# Patient Record
Sex: Male | Born: 1965
Health system: Southern US, Community
[De-identification: ages and names within clinical notes are randomized; demographics above are authoritative.]

## PROBLEM LIST (undated history)

## (undated) DIAGNOSIS — G4730 Sleep apnea, unspecified: Secondary | ICD-10-CM

## (undated) HISTORY — DX: Sleep apnea, unspecified: G47.30

---

## 2004-12-05 ENCOUNTER — Ambulatory Visit: Payer: Self-pay | Admitting: Family Medicine

## 2004-12-19 ENCOUNTER — Ambulatory Visit: Payer: Self-pay | Admitting: Family Medicine

## 2005-10-15 ENCOUNTER — Ambulatory Visit: Payer: Self-pay | Admitting: Family Medicine

## 2006-03-13 ENCOUNTER — Ambulatory Visit: Payer: Self-pay | Admitting: Family Medicine

## 2006-07-15 ENCOUNTER — Ambulatory Visit: Payer: Self-pay | Admitting: Family Medicine

## 2006-09-02 ENCOUNTER — Ambulatory Visit (HOSPITAL_BASED_OUTPATIENT_CLINIC_OR_DEPARTMENT_OTHER): Admission: RE | Admit: 2006-09-02 | Discharge: 2006-09-02 | Payer: Self-pay | Admitting: Orthopedic Surgery

## 2007-05-07 ENCOUNTER — Inpatient Hospital Stay (HOSPITAL_COMMUNITY): Admission: RE | Admit: 2007-05-07 | Discharge: 2007-05-09 | Payer: Self-pay | Admitting: Orthopaedic Surgery

## 2007-11-10 ENCOUNTER — Encounter: Payer: Self-pay | Admitting: Family Medicine

## 2007-11-18 DIAGNOSIS — M545 Low back pain: Secondary | ICD-10-CM

## 2007-11-21 ENCOUNTER — Encounter: Payer: Self-pay | Admitting: Family Medicine

## 2007-11-21 ENCOUNTER — Emergency Department (HOSPITAL_COMMUNITY): Admission: EM | Admit: 2007-11-21 | Discharge: 2007-11-21 | Payer: Self-pay | Admitting: Emergency Medicine

## 2008-01-14 ENCOUNTER — Encounter: Payer: Self-pay | Admitting: Family Medicine

## 2010-03-13 ENCOUNTER — Encounter: Payer: Self-pay | Admitting: Family Medicine

## 2010-10-04 NOTE — Letter (Signed)
Summary: [facility]  [facility]   Imported By: Lanelle Bal 03/17/2010 11:54:31  _____________________________________________________________________  External Attachment:    Type:   Image     Comment:   External Document  Appended Document: [facility]    Clinical Lists Changes

## 2011-01-16 NOTE — Op Note (Signed)
NAMEZACHERIE, [name]                ACCOUNT NO.:  [id number]   MEDICAL RECORD NO.:  [id number]          PATIENT TYPE:  INP   LOCATION:  2550                         FACILITY:  [facility]   PHYSICIAN:  Sharolyn Douglas, M.D.        DATE OF BIRTH:  12-09-1965   DATE OF PROCEDURE:  05/07/2007  DATE OF DISCHARGE:                               OPERATIVE REPORT   DIAGNOSES:  1. L5-S1 isthmic spondylolisthesis.  2. L5-S1 foraminal narrowing with right greater than left lower      extremity radiculopathy.   PROCEDURES:  1. L5-S1 laminectomy with wide decompression of the thecal sac and      nerve roots bilaterally.  2. L5-S1 arthrodesis, posterior.  3. Transforaminal lumbar interbody fusion L5-S1 with placement of 12      mm PEEK cage.  4. Pedicle screw instrumentation L5-S1 using the Abbott spine system.  5. Local autogenous bone graft supplemented with OP1 BMP.   SURGEON:  Sharolyn Douglas, MD.   ASSISTANT:  Orlin Hilding, PA.   ANESTHESIA:  General endotracheal.   ESTIMATED BLOOD LOSS:  300 mL.   COMPLICATIONS:  None.   Needle and sponge count correct.   INDICATIONS:  The patient is a pleasant 45 year old male with  progressive back and right greater than left lower extremity pain.  Imaging studies show an isthmic spondylolisthesis at L5-S1 with several  millimeters of motion through flexion and extension.  He has severe  foraminal stenosis right greater than left.  He has failed other  conservative treatment modalities and now presents for lumbar  decompression and fusion as above.  Risks, benefits and alternative were  reviewed.  The patient elected to proceed.   PROCEDURE:  After informed consent, he was taken to the operating room.  He underwent general endotracheal anesthesia without difficulty, given  prophylactic IV antibiotics.  Neuro monitoring was established in the  form of lower extremity EMGs and SSEPs.  He was carefully turned prone  onto the Wilson frame.  All bony prominences  padded.  Face and eyes  protected at all times.  Back prepped and draped in the usual sterile  fashion.  Midline incision made from L5 down to S1 in the midline.  Dissection was carried sharply through the deep fascia.  Subperiosteal  exposure was carried out to the tips of the transverse processes of L5  and the sacral ala bilaterally.  Intraoperative x-ray was taken to  confirm the levels.  The L5 spinous process and lamina was loose,  consistent with the diagnosis of isthmic spondylolisthesis.  Deep  retractors were placed.  We then performed a laminectomy by removing the  entire Gill fragment.  Further decompression out into the foramen and  lateral recess was completed out over the L5 nerve roots.  On the right  side, we found severe foraminal stenosis due to up-down narrowing of the  foramen and also impingement of the foramen by the cartilaginous  material from the pars defect.  This was all decompressed.  Similar  findings were noted on the left side but not has severe.  Once  we were  satisfied with our decompression, we turned our attention to placing  pedicle screws at L5 and S1 bilaterally.   By using antiprobing technique and also palpating the pedicles from  within the spinal canal, the pedicles were cannulated.  The pedicles  were palpated and there were no breeches, both within the pedicle itself  and also from within the spinal canal.  After the pedicles were tapped,  we placed 6.5 x 40 mm screws at L5 and 7.5 x 30 mm screws in the sacrum.  The bone quality was average and the screw purchase was acceptable.   At this point, we completed the posterior spinal arthrodesis by  decorticating the transverse processes of L5 and also the sacral ala  bilaterally.  Local autogenous bone graft obtained from the laminectomy  had been cleaned and morselized and this was packed into the lateral  gutters between L5-S1.   At this point, we elected to proceed with a transforaminal  lumbar  interbody fusion to further indirectly decompress the neuroforamen,  reduce the spondylolisthesis and improve the fusion rate.  On the right  side, the remaining facet joint was osteotomized.  The exiting  transversing nerve roots were identified and protected at all times.  Free running EMGs were monitored.  A transforaminal window was created.  The disk space was entered and a radical diskectomy completed.  The  cartilaginous endplates were scraped with curved curettes.  The disk  space was dilated up to 12 mm.  We then packed the disk space with local  bone graft from the laminectomy along with the OP-1 BMP.  We also packed  the BMP into the 12 mm PEEK cage.  The cage was then inserted into the  interspace and flipped longitudinally.  There were no deleterious  changes in the free running EMGs throughout the TLIF procedure.  At this  point, we turned our attention to placing short segment 40 mm titanium  rods into the polyaxial screw heads.  Gentle compression was applied  across the L5-S1 segment before shearing off the locking caps. We took  an intraoperative x-ray which showed appropriate positioning of the  instrumentation at L5-S1 with partial reduction of the  spondylolisthesis.  Hemostasis was achieved.  The wound was irrigated.  Gelfoam was left over the exposed epidural space.  The deep Hemovac  drain was placed.  The deep fascia closed with a running #1 Vicryl  suture.  Subcutaneous layer closed with 0 Vicryl and 2-0 Vicryl,  followed by a running 3-0 subcuticular Vicryl suture for the skin edges.  Dermabond was applied.  Sterile dressing placed.  Patient turned supine,  extubated without difficulty and transferred to recovery in stable  condition.   It should be noted my assistant, Orlin Hilding, PA, was present throughout  the procedure.  She assisted me with positioning and also exposure by  using the suction and Cobb elevators.  She then worked with me using   loupes and headlight magnification during the laminectomy and also the  TLIF and arthrodesis.  Finally, she assisted with wound closure.      Sharolyn Douglas, M.D.  Electronically Signed     MC/MEDQ  D:  05/07/2007  T:  05/07/2007  Job:  [id number]

## 2011-01-19 NOTE — Op Note (Signed)
NAMEGOVANI, RADLOFF                ACCOUNT NO.:  [id number]   MEDICAL RECORD NO.:  [id number]          PATIENT TYPE:  AMB   LOCATION:  DSC                          FACILITY:  [facility]   PHYSICIAN:  Robert A. Thurston Hole, M.D. DATE OF BIRTH:  1966/03/16   DATE OF PROCEDURE:  09/02/2006  DATE OF DISCHARGE:                               OPERATIVE REPORT   PREOPERATIVE DIAGNOSES:  1. Right knee medial and lateral meniscal tears with chondromalacia,      degenerative joint disease and synovitis.  2. Left knee chondromalacia and synovitis.   POSTOPERATIVE DIAGNOSES:  1. Right knee medial and lateral meniscal tears with chondromalacia,      degenerative joint disease and synovitis.  2. Left knee chondromalacia and synovitis.   PROCEDURES:  1. Right knee examination under anesthesia, followed by arthroscopic      partial medial and lateral meniscectomies.  2. Right knee chondroplasty with partial synovectomy.  3. Left knee cortisone injection.   SURGEON:  Elana Alm. Thurston Hole, M.D.   ASSISTANT:  Julien Girt, P.A.   ANESTHESIA:  General.   OPERATIVE TIME:  30 minutes.   COMPLICATIONS:  None.   INDICATIONS FOR PROCEDURES:  [name] is a 45 year old gentleman, who has  had significant right knee pain, and to a lesser degree, left knee pain  for the past 6 months increasing in nature, with exam and MRI  documenting medial meniscal tear with chondromalacia, DJD and synovitis.  He has failed conservative care and is now to undergo right knee  arthroscopy and left knee cortisone injection.   DESCRIPTION:  Mr. Centrella was brought to the operating room on 09/02/06,  and placed on the operating table in supine position.  After being  placed under general anesthesia, his right knee was examined.  He had a  full range of motion and his knee was stable on ligamentous exam, with  normal patellar tracking.  Left knee had full range of motion and the  knee was stable on ligamentous exam with normal  patellar tracking.  The  left knee was injected with 40 mg of Depo-Medrol and 3 cc of 0.25%  Marcaine with epinephrine under sterile conditions, and he tolerated  this well.  The right knee was injected with 0.25% Marcaine with  epinephrine.  He tolerated this well, and the right leg was then prepped  using sterile DuraPrep, and draped using sterile technique.  Originally,  through a right knee anterolateral portal, the arthroscope with a pump  was placed, and through an anteromedial portal, an arthroscopic probe  was placed.  On initial inspection of the medial compartment, he was  found to have 20% grade 4, and the rest, grade 3 chondromalacia, which  was debrided.  The medial meniscus remnant, of which he had had a  partial previous medial meniscectomy - he still had 50% of the medial  meniscus left, and another 30 to 40% of this was torn and this was  resected back to a stable rim.  Intercondylar notch inspected.  Anterior  and posterior cruciate ligaments were normal.  Lateral compartment  showed 25% grade  3 chondromalacia, which was debrided.  Lateral meniscus  partial tear, 20%, posterolateral horn, which was resected back to a  stable rim.  Patellofemoral joint showed 50% grade 3 chondromalacia on  the patellofemoral groove, and this was debrided.  The patella tracked  normally.  Moderate synovitis in the medial and lateral gutters was  debrided, otherwise, they were free of pathology.  After this was done,  it was felt that all pathology had been satisfactorily addressed.  Instruments were removed.  Portals were closed with 3-0 nylon suture and  injected with 0.25% Marcaine with epinephrine and 4 mg of morphine.  Sterile dressings were applied, and the patient was awakened and taken  to the recovery room in stable condition.   FOLLOWUP CARE:  Mr. Dowding will be followed as an outpatient on Percocet  and Naprosyn.  He will see me back in the office in a week for sutures  out and  followup.      Robert A. Thurston Hole, M.D.  Electronically Signed     RAW/MEDQ  D:  09/02/2006  T:  09/02/2006  Job:  [id number]

## 2011-05-28 LAB — I-STAT 8, (EC8 V) (CONVERTED LAB)
Acid-Base Excess: 1
Bicarbonate: 26.7 — ABNORMAL HIGH
Glucose, Bld: 98
Sodium: 138
TCO2: 28
pH, Ven: 7.377 — ABNORMAL HIGH

## 2011-06-15 LAB — TYPE AND SCREEN
ABO/RH(D): A POS
Antibody Screen: NEGATIVE

## 2011-06-15 LAB — URINALYSIS, ROUTINE W REFLEX MICROSCOPIC
Nitrite: NEGATIVE
Specific Gravity, Urine: 1.023
Urobilinogen, UA: 0.2

## 2011-06-15 LAB — PROTIME-INR: INR: 1

## 2011-06-15 LAB — HEMOGLOBIN AND HEMATOCRIT, BLOOD
HCT: 38.9 — ABNORMAL LOW
Hemoglobin: 12.2 — ABNORMAL LOW
Hemoglobin: 13.5

## 2011-06-15 LAB — BASIC METABOLIC PANEL
BUN: 9
Calcium: 8.4
Calcium: 8.9
Chloride: 103
Creatinine, Ser: 0.88
Creatinine, Ser: 0.92
GFR calc Af Amer: >60
GFR calc non Af Amer: >60
GFR calc non Af Amer: >60
Sodium: 138

## 2011-06-15 LAB — URINE CULTURE

## 2011-06-15 LAB — CBC
Hemoglobin: 15.8
MCHC: 34.7
Platelets: 234
RDW: 13.6

## 2011-06-15 LAB — COMPREHENSIVE METABOLIC PANEL
ALT: 24
Calcium: 9.8
Glucose, Bld: 75
Sodium: 137
Total Protein: 6.9

## 2011-06-15 LAB — ABO/RH: ABO/RH(D): A POS

## 2011-06-15 LAB — APTT: aPTT: 30

## 2011-06-15 LAB — DIFFERENTIAL
Eosinophils Absolute: 0.4
Lymphs Abs: 3
Monocytes Relative: 6
Neutro Abs: 7.3
Neutrophils Relative %: 63

## 2014-08-30 ENCOUNTER — Ambulatory Visit: Payer: Self-pay

## 2014-08-30 ENCOUNTER — Other Ambulatory Visit: Payer: Self-pay | Admitting: Occupational Medicine

## 2014-08-30 DIAGNOSIS — M25562 Pain in left knee: Secondary | ICD-10-CM

## 2014-09-03 HISTORY — PX: JOINT REPLACEMENT: SHX530

## 2017-08-23 DIAGNOSIS — Z96652 Presence of left artificial knee joint: Secondary | ICD-10-CM | POA: Insufficient documentation

## 2018-09-03 HISTORY — PX: REVISION TOTAL KNEE ARTHROPLASTY: SUR1280

## 2018-11-01 DIAGNOSIS — J028 Acute pharyngitis due to other specified organisms: Secondary | ICD-10-CM | POA: Diagnosis not present

## 2018-11-24 DIAGNOSIS — Z0000 Encounter for general adult medical examination without abnormal findings: Secondary | ICD-10-CM | POA: Diagnosis not present

## 2019-01-12 DIAGNOSIS — M25462 Effusion, left knee: Secondary | ICD-10-CM | POA: Diagnosis not present

## 2019-01-12 DIAGNOSIS — Z96652 Presence of left artificial knee joint: Secondary | ICD-10-CM | POA: Diagnosis not present

## 2019-01-12 DIAGNOSIS — M25562 Pain in left knee: Secondary | ICD-10-CM | POA: Diagnosis not present

## 2019-02-11 DIAGNOSIS — M25462 Effusion, left knee: Secondary | ICD-10-CM | POA: Diagnosis not present

## 2019-02-11 DIAGNOSIS — M25562 Pain in left knee: Secondary | ICD-10-CM | POA: Diagnosis not present

## 2019-04-15 DIAGNOSIS — M25569 Pain in unspecified knee: Secondary | ICD-10-CM | POA: Diagnosis not present

## 2019-04-15 DIAGNOSIS — T84033D Mechanical loosening of internal left knee prosthetic joint, subsequent encounter: Secondary | ICD-10-CM | POA: Diagnosis not present

## 2019-04-15 DIAGNOSIS — Z01818 Encounter for other preprocedural examination: Secondary | ICD-10-CM | POA: Diagnosis not present

## 2019-04-28 DIAGNOSIS — R0609 Other forms of dyspnea: Secondary | ICD-10-CM | POA: Diagnosis not present

## 2019-04-28 DIAGNOSIS — M25562 Pain in left knee: Secondary | ICD-10-CM | POA: Diagnosis not present

## 2019-04-28 DIAGNOSIS — Z885 Allergy status to narcotic agent status: Secondary | ICD-10-CM | POA: Diagnosis not present

## 2019-04-28 DIAGNOSIS — Y792 Prosthetic and other implants, materials and accessory orthopedic devices associated with adverse incidents: Secondary | ICD-10-CM | POA: Diagnosis not present

## 2019-04-28 DIAGNOSIS — Z880 Allergy status to penicillin: Secondary | ICD-10-CM | POA: Diagnosis not present

## 2019-04-28 DIAGNOSIS — T84033A Mechanical loosening of internal left knee prosthetic joint, initial encounter: Secondary | ICD-10-CM | POA: Diagnosis not present

## 2019-04-28 DIAGNOSIS — Z6839 Body mass index (BMI) 39.0-39.9, adult: Secondary | ICD-10-CM | POA: Diagnosis not present

## 2019-04-28 DIAGNOSIS — Z87891 Personal history of nicotine dependence: Secondary | ICD-10-CM | POA: Diagnosis not present

## 2019-04-28 DIAGNOSIS — G4733 Obstructive sleep apnea (adult) (pediatric): Secondary | ICD-10-CM | POA: Diagnosis not present

## 2019-04-28 DIAGNOSIS — E669 Obesity, unspecified: Secondary | ICD-10-CM | POA: Diagnosis not present

## 2019-04-28 DIAGNOSIS — G8918 Other acute postprocedural pain: Secondary | ICD-10-CM | POA: Diagnosis not present

## 2019-04-28 DIAGNOSIS — Z96651 Presence of right artificial knee joint: Secondary | ICD-10-CM | POA: Diagnosis not present

## 2019-05-01 DIAGNOSIS — M25662 Stiffness of left knee, not elsewhere classified: Secondary | ICD-10-CM | POA: Diagnosis not present

## 2019-05-01 DIAGNOSIS — M25562 Pain in left knee: Secondary | ICD-10-CM | POA: Diagnosis not present

## 2019-05-05 DIAGNOSIS — M25662 Stiffness of left knee, not elsewhere classified: Secondary | ICD-10-CM | POA: Diagnosis not present

## 2019-05-05 DIAGNOSIS — M25562 Pain in left knee: Secondary | ICD-10-CM | POA: Diagnosis not present

## 2019-05-07 DIAGNOSIS — M25662 Stiffness of left knee, not elsewhere classified: Secondary | ICD-10-CM | POA: Diagnosis not present

## 2019-05-07 DIAGNOSIS — M25562 Pain in left knee: Secondary | ICD-10-CM | POA: Diagnosis not present

## 2019-05-12 DIAGNOSIS — M25562 Pain in left knee: Secondary | ICD-10-CM | POA: Diagnosis not present

## 2019-05-12 DIAGNOSIS — M25662 Stiffness of left knee, not elsewhere classified: Secondary | ICD-10-CM | POA: Diagnosis not present

## 2019-05-14 DIAGNOSIS — M25662 Stiffness of left knee, not elsewhere classified: Secondary | ICD-10-CM | POA: Diagnosis not present

## 2019-05-14 DIAGNOSIS — M25562 Pain in left knee: Secondary | ICD-10-CM | POA: Diagnosis not present

## 2019-05-19 DIAGNOSIS — M25562 Pain in left knee: Secondary | ICD-10-CM | POA: Diagnosis not present

## 2019-05-19 DIAGNOSIS — M25662 Stiffness of left knee, not elsewhere classified: Secondary | ICD-10-CM | POA: Diagnosis not present

## 2019-05-21 DIAGNOSIS — M25562 Pain in left knee: Secondary | ICD-10-CM | POA: Diagnosis not present

## 2019-05-21 DIAGNOSIS — M25662 Stiffness of left knee, not elsewhere classified: Secondary | ICD-10-CM | POA: Diagnosis not present

## 2019-05-26 DIAGNOSIS — M25662 Stiffness of left knee, not elsewhere classified: Secondary | ICD-10-CM | POA: Diagnosis not present

## 2019-05-26 DIAGNOSIS — M25562 Pain in left knee: Secondary | ICD-10-CM | POA: Diagnosis not present

## 2019-05-28 DIAGNOSIS — M25562 Pain in left knee: Secondary | ICD-10-CM | POA: Diagnosis not present

## 2019-05-28 DIAGNOSIS — M25662 Stiffness of left knee, not elsewhere classified: Secondary | ICD-10-CM | POA: Diagnosis not present

## 2019-06-02 DIAGNOSIS — M25562 Pain in left knee: Secondary | ICD-10-CM | POA: Diagnosis not present

## 2019-06-02 DIAGNOSIS — M25662 Stiffness of left knee, not elsewhere classified: Secondary | ICD-10-CM | POA: Diagnosis not present

## 2019-06-04 DIAGNOSIS — M25662 Stiffness of left knee, not elsewhere classified: Secondary | ICD-10-CM | POA: Diagnosis not present

## 2019-06-04 DIAGNOSIS — M25562 Pain in left knee: Secondary | ICD-10-CM | POA: Diagnosis not present

## 2019-06-09 DIAGNOSIS — M25662 Stiffness of left knee, not elsewhere classified: Secondary | ICD-10-CM | POA: Diagnosis not present

## 2019-06-09 DIAGNOSIS — M25562 Pain in left knee: Secondary | ICD-10-CM | POA: Diagnosis not present

## 2019-06-12 DIAGNOSIS — M25662 Stiffness of left knee, not elsewhere classified: Secondary | ICD-10-CM | POA: Diagnosis not present

## 2019-06-12 DIAGNOSIS — M25562 Pain in left knee: Secondary | ICD-10-CM | POA: Diagnosis not present

## 2019-06-16 DIAGNOSIS — M25562 Pain in left knee: Secondary | ICD-10-CM | POA: Diagnosis not present

## 2019-06-16 DIAGNOSIS — M25662 Stiffness of left knee, not elsewhere classified: Secondary | ICD-10-CM | POA: Diagnosis not present

## 2019-06-19 DIAGNOSIS — M25662 Stiffness of left knee, not elsewhere classified: Secondary | ICD-10-CM | POA: Diagnosis not present

## 2019-06-19 DIAGNOSIS — M25562 Pain in left knee: Secondary | ICD-10-CM | POA: Diagnosis not present

## 2019-06-23 DIAGNOSIS — M25662 Stiffness of left knee, not elsewhere classified: Secondary | ICD-10-CM | POA: Diagnosis not present

## 2019-06-23 DIAGNOSIS — M25562 Pain in left knee: Secondary | ICD-10-CM | POA: Diagnosis not present

## 2019-06-25 DIAGNOSIS — M5431 Sciatica, right side: Secondary | ICD-10-CM | POA: Diagnosis not present

## 2019-06-25 DIAGNOSIS — Z981 Arthrodesis status: Secondary | ICD-10-CM | POA: Diagnosis not present

## 2019-06-25 DIAGNOSIS — M25662 Stiffness of left knee, not elsewhere classified: Secondary | ICD-10-CM | POA: Diagnosis not present

## 2019-06-25 DIAGNOSIS — M25562 Pain in left knee: Secondary | ICD-10-CM | POA: Diagnosis not present

## 2019-06-25 DIAGNOSIS — M5432 Sciatica, left side: Secondary | ICD-10-CM | POA: Diagnosis not present

## 2019-06-29 DIAGNOSIS — M25562 Pain in left knee: Secondary | ICD-10-CM | POA: Diagnosis not present

## 2019-06-29 DIAGNOSIS — M25662 Stiffness of left knee, not elsewhere classified: Secondary | ICD-10-CM | POA: Diagnosis not present

## 2019-07-15 DIAGNOSIS — M5416 Radiculopathy, lumbar region: Secondary | ICD-10-CM | POA: Diagnosis not present

## 2019-07-15 DIAGNOSIS — M545 Low back pain: Secondary | ICD-10-CM | POA: Diagnosis not present

## 2019-07-16 DIAGNOSIS — M545 Low back pain: Secondary | ICD-10-CM | POA: Diagnosis not present

## 2019-07-16 DIAGNOSIS — M5416 Radiculopathy, lumbar region: Secondary | ICD-10-CM | POA: Diagnosis not present

## 2019-07-21 DIAGNOSIS — M545 Low back pain: Secondary | ICD-10-CM | POA: Diagnosis not present

## 2019-07-21 DIAGNOSIS — M5416 Radiculopathy, lumbar region: Secondary | ICD-10-CM | POA: Diagnosis not present

## 2019-07-23 DIAGNOSIS — Z0189 Encounter for other specified special examinations: Secondary | ICD-10-CM | POA: Diagnosis not present

## 2019-07-23 DIAGNOSIS — Z139 Encounter for screening, unspecified: Secondary | ICD-10-CM | POA: Diagnosis not present

## 2019-07-23 DIAGNOSIS — M5431 Sciatica, right side: Secondary | ICD-10-CM | POA: Diagnosis not present

## 2019-07-23 DIAGNOSIS — M5432 Sciatica, left side: Secondary | ICD-10-CM | POA: Diagnosis not present

## 2019-07-31 DIAGNOSIS — M5432 Sciatica, left side: Secondary | ICD-10-CM | POA: Diagnosis not present

## 2019-07-31 DIAGNOSIS — M5431 Sciatica, right side: Secondary | ICD-10-CM | POA: Diagnosis not present

## 2019-08-07 DIAGNOSIS — M5136 Other intervertebral disc degeneration, lumbar region: Secondary | ICD-10-CM | POA: Diagnosis not present

## 2019-08-24 DIAGNOSIS — M5416 Radiculopathy, lumbar region: Secondary | ICD-10-CM | POA: Diagnosis not present

## 2019-09-04 HISTORY — PX: BILATERAL CARPAL TUNNEL RELEASE: SHX6508

## 2019-09-17 DIAGNOSIS — R202 Paresthesia of skin: Secondary | ICD-10-CM | POA: Diagnosis not present

## 2019-09-17 DIAGNOSIS — G5603 Carpal tunnel syndrome, bilateral upper limbs: Secondary | ICD-10-CM | POA: Diagnosis not present

## 2019-09-29 DIAGNOSIS — G5603 Carpal tunnel syndrome, bilateral upper limbs: Secondary | ICD-10-CM | POA: Diagnosis not present

## 2019-10-19 DIAGNOSIS — M5416 Radiculopathy, lumbar region: Secondary | ICD-10-CM | POA: Diagnosis not present

## 2019-12-21 DIAGNOSIS — M5416 Radiculopathy, lumbar region: Secondary | ICD-10-CM | POA: Diagnosis not present

## 2020-03-29 DIAGNOSIS — G5601 Carpal tunnel syndrome, right upper limb: Secondary | ICD-10-CM | POA: Diagnosis not present

## 2020-05-18 DIAGNOSIS — G4733 Obstructive sleep apnea (adult) (pediatric): Secondary | ICD-10-CM | POA: Diagnosis not present

## 2020-05-18 DIAGNOSIS — K219 Gastro-esophageal reflux disease without esophagitis: Secondary | ICD-10-CM | POA: Diagnosis not present

## 2020-05-18 DIAGNOSIS — G8918 Other acute postprocedural pain: Secondary | ICD-10-CM | POA: Diagnosis not present

## 2020-05-18 DIAGNOSIS — G5621 Lesion of ulnar nerve, right upper limb: Secondary | ICD-10-CM | POA: Diagnosis not present

## 2020-05-18 DIAGNOSIS — M25531 Pain in right wrist: Secondary | ICD-10-CM | POA: Diagnosis not present

## 2020-05-18 DIAGNOSIS — Z880 Allergy status to penicillin: Secondary | ICD-10-CM | POA: Diagnosis not present

## 2020-05-18 DIAGNOSIS — E669 Obesity, unspecified: Secondary | ICD-10-CM | POA: Diagnosis not present

## 2020-05-18 DIAGNOSIS — Z96659 Presence of unspecified artificial knee joint: Secondary | ICD-10-CM | POA: Diagnosis not present

## 2020-05-18 DIAGNOSIS — Z6838 Body mass index (BMI) 38.0-38.9, adult: Secondary | ICD-10-CM | POA: Diagnosis not present

## 2020-05-18 DIAGNOSIS — Z885 Allergy status to narcotic agent status: Secondary | ICD-10-CM | POA: Diagnosis not present

## 2020-05-18 DIAGNOSIS — G5601 Carpal tunnel syndrome, right upper limb: Secondary | ICD-10-CM | POA: Diagnosis not present

## 2020-06-17 DIAGNOSIS — G5622 Lesion of ulnar nerve, left upper limb: Secondary | ICD-10-CM | POA: Diagnosis not present

## 2020-06-17 DIAGNOSIS — Z880 Allergy status to penicillin: Secondary | ICD-10-CM | POA: Diagnosis not present

## 2020-06-17 DIAGNOSIS — Z20822 Contact with and (suspected) exposure to covid-19: Secondary | ICD-10-CM | POA: Diagnosis not present

## 2020-06-17 DIAGNOSIS — Z882 Allergy status to sulfonamides status: Secondary | ICD-10-CM | POA: Diagnosis not present

## 2020-06-17 DIAGNOSIS — Z8616 Personal history of COVID-19: Secondary | ICD-10-CM | POA: Diagnosis not present

## 2020-06-17 DIAGNOSIS — E119 Type 2 diabetes mellitus without complications: Secondary | ICD-10-CM | POA: Diagnosis not present

## 2020-06-17 DIAGNOSIS — M25532 Pain in left wrist: Secondary | ICD-10-CM | POA: Diagnosis not present

## 2020-06-17 DIAGNOSIS — Z87891 Personal history of nicotine dependence: Secondary | ICD-10-CM | POA: Diagnosis not present

## 2020-06-17 DIAGNOSIS — Z885 Allergy status to narcotic agent status: Secondary | ICD-10-CM | POA: Diagnosis not present

## 2020-06-17 DIAGNOSIS — G4733 Obstructive sleep apnea (adult) (pediatric): Secondary | ICD-10-CM | POA: Diagnosis not present

## 2020-06-17 DIAGNOSIS — M1711 Unilateral primary osteoarthritis, right knee: Secondary | ICD-10-CM | POA: Diagnosis not present

## 2020-06-17 DIAGNOSIS — G8918 Other acute postprocedural pain: Secondary | ICD-10-CM | POA: Diagnosis not present

## 2020-06-17 DIAGNOSIS — Z86711 Personal history of pulmonary embolism: Secondary | ICD-10-CM | POA: Diagnosis not present

## 2020-06-17 DIAGNOSIS — G5602 Carpal tunnel syndrome, left upper limb: Secondary | ICD-10-CM | POA: Diagnosis not present

## 2020-08-01 ENCOUNTER — Ambulatory Visit (INDEPENDENT_AMBULATORY_CARE_PROVIDER_SITE_OTHER)
Admission: RE | Admit: 2020-08-01 | Discharge: 2020-08-01 | Disposition: A | Discharge status: Home / Self Care | Payer: BC Managed Care – PPO | Source: Ambulatory Visit | Attending: Family Medicine | Admitting: Family Medicine

## 2020-08-01 ENCOUNTER — Encounter: Payer: Self-pay | Admitting: Family Medicine

## 2020-08-01 ENCOUNTER — Other Ambulatory Visit: Payer: Self-pay

## 2020-08-01 ENCOUNTER — Ambulatory Visit (INDEPENDENT_AMBULATORY_CARE_PROVIDER_SITE_OTHER): Payer: BC Managed Care – PPO | Admitting: Family Medicine

## 2020-08-01 DIAGNOSIS — R0602 Shortness of breath: Secondary | ICD-10-CM

## 2020-08-01 DIAGNOSIS — Z9989 Dependence on other enabling machines and devices: Secondary | ICD-10-CM

## 2020-08-01 DIAGNOSIS — Z636 Dependent relative needing care at home: Secondary | ICD-10-CM

## 2020-08-01 DIAGNOSIS — D171 Benign lipomatous neoplasm of skin and subcutaneous tissue of trunk: Secondary | ICD-10-CM | POA: Insufficient documentation

## 2020-08-01 DIAGNOSIS — Z125 Encounter for screening for malignant neoplasm of prostate: Secondary | ICD-10-CM | POA: Diagnosis not present

## 2020-08-01 DIAGNOSIS — Z7689 Persons encountering health services in other specified circumstances: Secondary | ICD-10-CM | POA: Diagnosis not present

## 2020-08-01 DIAGNOSIS — Z1211 Encounter for screening for malignant neoplasm of colon: Secondary | ICD-10-CM

## 2020-08-01 DIAGNOSIS — G4733 Obstructive sleep apnea (adult) (pediatric): Secondary | ICD-10-CM | POA: Diagnosis not present

## 2020-08-01 DIAGNOSIS — R0609 Other forms of dyspnea: Secondary | ICD-10-CM | POA: Insufficient documentation

## 2020-08-01 DIAGNOSIS — R0789 Other chest pain: Secondary | ICD-10-CM

## 2020-08-01 LAB — CBC WITH DIFFERENTIAL/PLATELET
Basophils Absolute: 0.1 10*3/uL (ref 0.0–0.1)
Basophils Relative: 1.3 % (ref 0.0–3.0)
Eosinophils Absolute: 0.4 10*3/uL (ref 0.0–0.7)
Eosinophils Relative: 5.3 % — ABNORMAL HIGH (ref 0.0–5.0)
HCT: 47.6 % (ref 39.0–52.0)
Hemoglobin: 16.5 g/dL (ref 13.0–17.0)
Lymphocytes Relative: 28.3 % (ref 12.0–46.0)
Lymphs Abs: 2.3 10*3/uL (ref 0.7–4.0)
MCHC: 34.5 g/dL (ref 30.0–36.0)
MCV: 89 fl (ref 78.0–100.0)
Monocytes Absolute: 0.5 10*3/uL (ref 0.1–1.0)
Monocytes Relative: 5.5 % (ref 3.0–12.0)
Neutro Abs: 4.9 10*3/uL (ref 1.4–7.7)
Neutrophils Relative %: 59.6 % (ref 43.0–77.0)
Platelets: 214 10*3/uL (ref 150.0–400.0)
RBC: 5.35 Mil/uL (ref 4.22–5.81)
RDW: 14 % (ref 11.5–15.5)
WBC: 8.2 10*3/uL (ref 4.0–10.5)

## 2020-08-01 LAB — TSH: TSH: 1.93 u[IU]/mL (ref 0.35–4.50)

## 2020-08-01 LAB — COMPREHENSIVE METABOLIC PANEL
ALT: 31 U/L (ref 0–53)
AST: 27 U/L (ref 0–37)
Albumin: 4.5 g/dL (ref 3.5–5.2)
Alkaline Phosphatase: 48 U/L (ref 39–117)
BUN: 10 mg/dL (ref 6–23)
CO2: 30 mEq/L (ref 19–32)
Calcium: 9.8 mg/dL (ref 8.4–10.5)
Chloride: 99 mEq/L (ref 96–112)
Creatinine, Ser: 1.05 mg/dL (ref 0.40–1.50)
GFR: 81.68 mL/min (ref >60.00)
Glucose, Bld: 127 mg/dL — ABNORMAL HIGH (ref 70–99)
Potassium: 4 mEq/L (ref 3.5–5.1)
Sodium: 137 mEq/L (ref 135–145)
Total Bilirubin: 0.6 mg/dL (ref 0.2–1.2)
Total Protein: 7.7 g/dL (ref 6.0–8.3)

## 2020-08-01 LAB — VITAMIN D 25 HYDROXY (VIT D DEFICIENCY, FRACTURES): VITD: 38.08 ng/mL (ref 30.00–100.00)

## 2020-08-01 LAB — LIPID PANEL
Cholesterol: 134 mg/dL (ref 0–200)
HDL: 37.9 mg/dL — ABNORMAL LOW (ref >39.00)
LDL Cholesterol: 56 mg/dL (ref 0–99)
NonHDL: 95.76
Total CHOL/HDL Ratio: 4
Triglycerides: 200 mg/dL — ABNORMAL HIGH (ref 0.0–149.0)
VLDL: 40 mg/dL (ref 0.0–40.0)

## 2020-08-01 LAB — HEMOGLOBIN A1C: Hgb A1c MFr Bld: 5.5 % (ref 4.6–6.5)

## 2020-08-01 LAB — PSA: PSA: 1.25 ng/mL (ref 0.10–4.00)

## 2020-08-01 NOTE — Patient Instructions (Addendum)
Good to see you today  Please work on two things with your diet- cut out drinks with sugar (juice, soda, sweet tea) and cutting out snacks. Then work on cutting back on starches- pasta, bread, sweets, potatoes, rice.   Try  Looking for low carb or Keto recipes for soups, casseroles. Try making your lunches ahead of time and eating more protein at breakfast.   There is not one right eating plan for everyone.  It may take trial and error to find what will work for you.  It is important to get adequate protein and fiber with your meals.  It is okay to not eat breakfast or to skip meals if you are not hungry.  Avoid snacking between meals.  Unless you are on a fluid restriction, drink 80 to 90 ounces of water a day.  Suggested resources- www.dietdoctor.com/diabetes/diet www.adaptyourlifeacademy.com-there is a quiz to help you determine how many carbohydrates you should eat a day  www.thefastingmethod.com  If you have diabetes or access to a blood sugar machine, I recommend you check your blood sugar daily and keep a log.  Vary the time you check your blood sugar such as fasting, before meal, 2 hours after a meal and at bedtime.  Look for trends with the foods you are eating and be a scientist of your body.  Here are some guidelines to help you with meal planning -  Avoid all processed and packaged foods (bread, pasta, crackers, chips, etc) and beverages containing calories.  Avoid added sugars and excessive natural sugars.  Pay attention to how you feel if you consume artificial sweeteners.  Do they make you more hungry or raise your blood sugar?  With every meal and snack, aim to get 20 g of protein (3 ounces of meat, 4 ounces of fish, 3 eggs, protein powder, 1 cup [place] yogurt, 1 cup cottage cheese, etc.)  Increase fiber in the form of non-starchy vegetables.  These help you feel full with very little carbohydrates and are good for gut health.  Nonstarchy vegetables include summer squash, onions,  peppers, tomatoes, eggplant, broccoli, cauliflower, cabbage, lettuce, spinach.  Have small amounts of good fats such as avocado, nuts, olive oil, nut butters, olives.  Add a little cheese to your meals to make them tasty.   Try to plan your meals for the week and do some meal preparation when able.  If possible, make lunches for the week ahead of time.  Plan a couple of dinners and make enough so you can have leftovers.  Build in a treat once a week.

## 2020-08-01 NOTE — Progress Notes (Signed)
Subjective:    Patient ID: [name], male    DOB: [birth year], 54 y.o.   MRN: [id number]  HPI Chief Complaint  Patient presents with  . New Patient (Initial Visit)    breathing issues X 1 year, aches and pains/ cyst on back, weight loss   This is a 54 yo male who presents today to establish care. Has not had regular medical care in many years. Lives with his wife. He is a Administrator, short trips. Has 2 dogs. Takes care of his mother who has alzheimer's. Wife has small cell lung cancer.   Last CPE- years ago PSA- never Colonoscopy- never  Tdap- had at work, not sure when Flu- does not  Covid- has not had, not planning on getting Dental- 1.5 years ago, over due Eye- within last 2 years Exercise- walks dogs every day, 25 minutes.   Obesity- has gained weight, 75 pounds up in last 5 years. Breakfast- 2 frozen waffles, lunch- carryout, meat and 2 sides, when working gets [employer] sandwich. Dinner- taco bell. Patient does shopping, little cooking at home. Wife not able to help.   Cyst on back- may be getting a little bigger, no pain or drainage.   Chest pain- occurs about 1x week, sharp, aches, lasts 5-10 minutes, no radiation, feels warm, but no diaphoresis, occasional nausea, relieved with Tums. No foot swelling. Never with walking dogs, working.   SOB- mostly with activity. Occasionally "breathes heavy" when sitting down. Rare wheezing, no cough. Father with asthma. Patient painted cars for many years.   Myalgias/ arthralgias- has had both knees replaced.   OSA with CPAP- hasn't used machine while out of work for carpel tunnel syndrome. Has been watching TV and falling asleep in chair.   Review of Systems  Constitutional: Positive for unexpected weight change. Negative for diaphoresis.  Eyes: Negative for visual disturbance.  Respiratory: Positive for shortness of breath.   Cardiovascular: Positive for chest pain. Negative for palpitations and leg swelling.   Gastrointestinal: Negative for abdominal pain, blood in stool, constipation and diarrhea.  Genitourinary: Negative for difficulty urinating and dysuria.  Musculoskeletal: Positive for arthralgias.  Neurological: Negative for headaches.       Objective:   Physical Exam Vitals reviewed.  Constitutional:      General: He is not in acute distress.    Appearance: Normal appearance. He is obese. He is not ill-appearing, toxic-appearing or diaphoretic.  HENT:     Head: Normocephalic and atraumatic.  Eyes:     Conjunctiva/sclera: Conjunctivae normal.  Cardiovascular:     Rate and Rhythm: Normal rate and regular rhythm.     Heart sounds: Normal heart sounds.  Pulmonary:     Effort: Pulmonary effort is normal.     Breath sounds: Normal breath sounds.  Musculoskeletal:     Cervical back: Normal range of motion and neck supple. No rigidity or tenderness.     Right lower leg: No edema.     Left lower leg: No edema.  Lymphadenopathy:     Cervical: No cervical adenopathy.  Skin:    General: Skin is warm and dry.     Findings: Lesion present.       Neurological:     Mental Status: He is alert and oriented to person, place, and time.  Psychiatric:        Mood and Affect: Mood normal.        Behavior: Behavior normal.        Thought  Content: Thought content normal.        Judgment: Judgment normal.       BP 120/80   Pulse 98   Temp (!) 97.1 F (36.2 C) (Tympanic)   Ht 5\' 11"  (1.803 m)   Wt 297 lb 12.8 oz (135.1 kg)   SpO2 94%   BMI 41.53 kg/m  Depression screen [facility] 2/9 08/01/2020  Decreased Interest 0  Down, Depressed, Hopeless 0  PHQ - 2 Score 0       Assessment & Plan:  1. Encounter to establish care - reviewed available records - discussed overdue health maintenance, advised him to have Covid 19 vaccine  2. Morbid obesity ([place]) - discussed diet and his limitations given constraints on his time, provided written information and discussed short term goals -  TSH - Hemoglobin A1c - Comprehensive metabolic panel - CBC with Differential - Lipid Panel - VITAMIN D 25 Hydroxy (Vit-D Deficiency, Fractures)  3. SOB (shortness of breath) - Ambulatory referral to Pulmonology - DG Chest 2 View; Future  4. OSA on CPAP - has not been compliant since carpal tunnel surgery, discussed impact on health and encouraged him to use nightly - Ambulatory referral to Pulmonology  5. Screening for prostate cancer - PSA  6. Screening for colon cancer - Ambulatory referral to Gastroenterology  7. Lipoma of back - continue to monitor, can have removed by general surgery if it enlarges or becomes painful or infected  8. Other chest pain - low suspicion for cardiac etiology, follow up precautions reviewed with patient  9. Caregiver stress - discussed importance of self care  - follow up in 3 months for CPE  This visit occurred during the SARS-CoV-2 public health emergency.  Safety protocols were in place, including screening questions prior to the visit, additional usage of staff PPE, and extensive cleaning of exam room while observing appropriate contact time as indicated for disinfecting solutions.      Clarene Reamer, FNP-BC  [facility], [facility]  08/01/2020 1:10 PM

## 2020-08-05 ENCOUNTER — Telehealth (INDEPENDENT_AMBULATORY_CARE_PROVIDER_SITE_OTHER): Payer: Self-pay | Admitting: Gastroenterology

## 2020-08-05 ENCOUNTER — Other Ambulatory Visit: Payer: Self-pay

## 2020-08-05 DIAGNOSIS — Z981 Arthrodesis status: Secondary | ICD-10-CM | POA: Insufficient documentation

## 2020-08-05 DIAGNOSIS — Z1211 Encounter for screening for malignant neoplasm of colon: Secondary | ICD-10-CM

## 2020-08-05 NOTE — Progress Notes (Signed)
Gastroenterology Pre-Procedure Review  Request Date: Friday 09/30/20 Requesting Physician: Dr. Vicente Males  PATIENT REVIEW QUESTIONS: The patient responded to the following health history questions as indicated:    1. Are you having any GI issues? no 2. Do you have a personal history of Polyps? no 3. Do you have a family history of Colon Cancer or Polyps? no 4. Diabetes Mellitus? no 5. Joint replacements in the past 12 months?no 6. Major health problems in the past 3 months?no 7. Any artificial heart valves, MVP, or defibrillator?no    MEDICATIONS & ALLERGIES:    Patient reports the following regarding taking any anticoagulation/antiplatelet therapy:   Plavix, Coumadin, Eliquis, Xarelto, Lovenox, Pradaxa, Brilinta, or Effient? no Aspirin? no  Patient confirms/reports the following medications:  Current Outpatient Medications  Medication Sig Dispense Refill  . Celecoxib (CELEBREX PO) Take by mouth daily.     No current facility-administered medications for this visit.    Patient confirms/reports the following allergies:  Allergies  Allergen Reactions  . Penicillins Other (See Comments)  . Oxycodone     No orders of the defined types were placed in this encounter.   AUTHORIZATION INFORMATION Primary Insurance: 1D#: Group #:  Secondary Insurance: 1D#: Group #:  SCHEDULE INFORMATION: Date: Friday 09/30/20 Time: Location:[facility]

## 2020-09-05 ENCOUNTER — Other Ambulatory Visit: Payer: Self-pay

## 2020-09-05 ENCOUNTER — Ambulatory Visit (INDEPENDENT_AMBULATORY_CARE_PROVIDER_SITE_OTHER): Payer: BC Managed Care – PPO | Admitting: Pulmonary Disease

## 2020-09-05 ENCOUNTER — Encounter: Payer: Self-pay | Admitting: Pulmonary Disease

## 2020-09-05 VITALS — BP 126/74 | HR 77 | Ht 71.0 in | Wt 292.5 lb

## 2020-09-05 DIAGNOSIS — Z23 Encounter for immunization: Secondary | ICD-10-CM

## 2020-09-05 DIAGNOSIS — G4733 Obstructive sleep apnea (adult) (pediatric): Secondary | ICD-10-CM

## 2020-09-05 DIAGNOSIS — R0602 Shortness of breath: Secondary | ICD-10-CM

## 2020-09-05 DIAGNOSIS — Z87891 Personal history of nicotine dependence: Secondary | ICD-10-CM | POA: Insufficient documentation

## 2020-09-05 DIAGNOSIS — Z0000 Encounter for general adult medical examination without abnormal findings: Secondary | ICD-10-CM | POA: Insufficient documentation

## 2020-09-05 DIAGNOSIS — R0600 Dyspnea, unspecified: Secondary | ICD-10-CM | POA: Diagnosis not present

## 2020-09-05 DIAGNOSIS — R0609 Other forms of dyspnea: Secondary | ICD-10-CM

## 2020-09-05 NOTE — Assessment & Plan Note (Signed)
Former smoker Quit 2016 40-pack-year smoking history  Plan: Referral to lung cancer screening program Will investigate cost of pulmonary function testing, patient would benefit from having a PFT performed

## 2020-09-05 NOTE — Assessment & Plan Note (Signed)
Recommend COVID-19 vaccinations, patient declined Recommend seasonal flu vaccine, patient to receive today Should patient have emphysema on CT imaging or COPD based off of pulmonary function testing would also recommend Pneumovax 23

## 2020-09-05 NOTE — Patient Instructions (Addendum)
You were seen today by Coral Ceo, NP  for:   1. Obstructive sleep apnea syndrome  We recommend that you continue using your CPAP daily >>>Keep up the hard work using your device >>> Goal should be wearing this for the entire night that you are sleeping, at least 4 to 6 hours  Remember:  . Do not drive or operate heavy machinery if tired or drowsy.  . Please notify the supply company and office if you are unable to use your device regularly due to missing supplies or machine being broken.  . Work on maintaining a healthy weight and following your recommended nutrition plan  . Maintain proper daily exercise and movement  . Maintaining proper use of your device can also help improve management of other chronic illnesses such as: Blood pressure, blood sugars, and weight management.   BiPAP/ CPAP Cleaning:  >>>Clean weekly, with Dawn soap, and bottle brush.  Set up to air dry. >>> Wipe mask out daily with wet wipe or towelette   2. Former smoker  We will refer you today to our lung cancer screening program >>>This is based off of your 40 pack-year smoking history >>> This is a recommendation from the [name] preventative services task force (USPSTF) >>>The USPSTF recommends annual screening for lung cancer with low-dose computed tomography (LDCT) in adults aged 29 to 80 years who have a 20 pack-year smoking history and currently smoke or have quit within the past 15 years. Screening should be discontinued once a person has not smoked for 15 years or develops a health problem that substantially limits life expectancy or the ability or willingness to have   We also recommend you obtain a pulmonary function test  Our office will investigate the cost of this as well as see if there is a difference between having this done in [place] or in the hospital here at [facility]  3. Healthcare maintenance  Regular flu vaccine today  Would recommend obtaining the COVID-19 vaccination as  discussed today   Follow Up:    Return in about 3 months (around 12/04/2020), or if symptoms worsen or fail to improve, for [facility].   Notification of test results are managed in the following manner: If there are  any recommendations or changes to the  plan of care discussed in office today,  we will contact you and let you know what they are. If you do not hear from [name], then your results are normal and you can view them through your  MyChart account , or a letter will be sent to you. Thank you again for trusting [name] with your care  - Thank you, [facility]    It is flu season:   >>> Best ways to protect herself from the flu: Receive the yearly flu vaccine, practice good hand hygiene washing with soap and also using hand sanitizer when available, eat a nutritious meals, get adequate rest, hydrate appropriately       Please contact the office if your symptoms worsen or you have concerns that you are not improving.   Thank you for choosing [facility] for your healthcare, and for allowing [name] to partner with you on your healthcare journey. I am thankful to be able to provide care to you today.   Elisha Headland FNP-C

## 2020-09-05 NOTE — Assessment & Plan Note (Signed)
Severe obstructive sleep apnea with a well-controlled AHI based off of patient's compliance report on his phone Over the last 90 days average AHI was 2.5 2017 sleep study showed severe obstructive sleep apnea with an AHI 51, supine sleep AHI of 128  Plan: Continue CPAP therapy Follow-up in 3 months to establish with sleep MD/pulmonary provider

## 2020-09-05 NOTE — Progress Notes (Signed)
Reviewed and agree with assessment/plan.   Coralyn Helling, MD [facility] 09/05/2020, 1:13 PM Pager:  [phone]

## 2020-09-05 NOTE — Assessment & Plan Note (Signed)
Dyspnea on exertion is likely multifactorial given patient's sedentary lifestyle, physical deconditioning, BMI of 40.  Patient has had almost 50 pound weight increase over the last 5 years.  Patient also with 40-pack-year smoking history so likely there is an aspect of either emphysema or COPD.  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  There is concern for also ILD.  Although this is reassuring the patient's most recent chest x-ray is clear.  Plan: We will complete lung cancer screening CT to further evaluate for emphysema, lung cancer risk, also to get a more detailed look of the chest to rule out ILD  Would recommend obtaining pulmonary function testing.  We will currently investigate what would be the most affordable for the patient to receive this either in [place] or at [facility] as patient is concerned about cost but is agreeable to proceed forward with pulmonary function testing

## 2020-09-05 NOTE — Progress Notes (Signed)
$'@Patient'd$  ID: [name], male    DOB: 07/01/1966, 55 y.o.   MRN: [id number]  Chief Complaint  Patient presents with  . sleep consult    Per Clarene Reamer, NP--wearing cpap avg 5hr nightly- feels pressure and mask is okay. C/o sob with exertion and occ wheezing x5-10y    Referring provider: Elby Beck, FNP  HPI:  55 year old male former smoker followed in our office for obstructive sleep apnea and dyspnea on exertion  PMH: Morbid obesity Smoker/ Smoking History: Former smoker.  Quit 2016.  40-pack-year smoking history Maintenance: None Pt of: Needs outpatient pulmonary provider  09/05/2020  - Visit   55 year old male former smoker initially referred to our office on 09/05/2020 to establish for obstructive sleep apnea as well as dyspnea on exertion.  Patient reports he has been more dyspneic over the last 10 years.  He is a former smoker.  Quit in 2016.  40-pack-year smoking history.  He has not on any maintenance inhalers.  He does have a history of obstructive sleep apnea.  He reports that he is using his CPAP every night.  He had a sleep study done at [facility].  This is available in [place] and showed severe obstructive sleep apnea from 2017.  Patient has a compliance report on his phone.  Over the last 3 months AHI has averaged 2.5.  This is well controlled.  Patient also endorses that he has had weight gain over the last 5 years since stopping smoking.  He estimates this to be around 50 pounds.  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  He feels that he has had progressive [place] worsening shortness of breath over the last 10 years.  With occasional wheezing.  He has never had a breathing test.  He does not have CT imaging.  He reports that he buys his CPAP supplies off of [place].  He received his CPAP originally from [employer]	[place].   Questionaires / Pulmonary Flowsheets:   ACT:  No flowsheet data found.  MMRC: No flowsheet data  found.  Epworth:  Results of the Epworth flowsheet 09/05/2020  Sitting and reading 2  Watching TV 3  Sitting, inactive in a public place (e.g. a theatre or a meeting) 2  As a passenger in a car for an hour without a break 2  Lying down to rest in the afternoon when circumstances permit 3  Sitting and talking to someone 1  Sitting quietly after a lunch without alcohol 2  In a car, while stopped for a few minutes in traffic 3  Total score 18    Tests:   08/01/2020-chest x-ray-no active cardiopulmonary disease  07/20/16 - sleep study  IMPRESSION   1. Severe obstructive sleep apnea (AHI = 51) - associated with oxygen  desaturations as low as 81%, arousals, and disruption of sleep  architecture. Apnea was severe in both lateral and supine positions, but  super severe in the supine position (Supine AHI = 128).  2. CPAP was titrated to 11 cm H20 and resulted in much improved  respiration and was tested in REM supine sleep. This was also associated  with perceived much improved sleep.  3. Very severe periodic limb movements of sleep (PLM index = 111/ hour).     FENO:  No results found for: NITRICOXIDE  PFT: No flowsheet data found.  WALK:  No flowsheet data found.  Imaging: No results found.  Lab Results:  CBC  Component Value Date/Time   WBC 8.2 08/01/2020 1118   RBC 5.35 08/01/2020 1118   HGB 16.5 08/01/2020 1118   HCT 47.6 08/01/2020 1118   PLT 214.0 08/01/2020 1118   MCV 89.0 08/01/2020 1118   MCHC 34.5 08/01/2020 1118   RDW 14.0 08/01/2020 1118   LYMPHSABS 2.3 08/01/2020 1118   MONOABS 0.5 08/01/2020 1118   EOSABS 0.4 08/01/2020 1118   BASOSABS 0.1 08/01/2020 1118    BMET    Component Value Date/Time   NA 137 08/01/2020 1118   K 4.0 08/01/2020 1118   CL 99 08/01/2020 1118   CO2 30 08/01/2020 1118   GLUCOSE 127 (H) 08/01/2020 1118   BUN 10 08/01/2020 1118   CREATININE 1.05 08/01/2020 1118   CALCIUM 9.8 08/01/2020 1118   GFRNONAA >60 05/09/2007  0410   GFRAA  05/09/2007 0410    >60        The eGFR has been calculated using the MDRD equation. This calculation has not been validated in all clinical    BNP No results found for: BNP  ProBNP No results found for: PROBNP  Specialty Problems      Pulmonary Problems   Dyspnea on exertion   Obstructive sleep apnea syndrome    07/20/2016-sleep study-severe obstructive sleep apnea with an AHI of 51, supine AHI 128, recommending CPAP pressure of 11, very severe periodic limb movements PLM index 111/an hour         Allergies  Allergen Reactions  . Penicillins Other (See Comments)  . Oxycodone     Immunization History  Administered Date(s) Administered  . Hepatitis B, adult 02/05/2017, 03/13/2017, 08/07/2017    Past Medical History:  Diagnosis Date  . Sleep apnea     Tobacco History: Social History   Tobacco Use  Smoking Status Former Smoker  . Packs/day: 1.00  . Years: 40.00  . Pack years: 40.00  . Types: Cigarettes  . Quit date: 2016  . Years since quitting: 6.0  Smokeless Tobacco Never Used   Counseling given: Not Answered   Continue to not smoke  Outpatient Encounter Medications as of 09/05/2020  Medication Sig  . Celecoxib (CELEBREX PO) Take by mouth daily.   No facility-administered encounter medications on file as of 09/05/2020.     Review of Systems  Review of Systems  Constitutional: Positive for fatigue. Negative for activity change, chills, fever and unexpected weight change.  HENT: Negative for postnasal drip, rhinorrhea, sinus pressure, sinus pain and sore throat.   Eyes: Negative.   Respiratory: Positive for shortness of breath and wheezing. Negative for cough.   Cardiovascular: Positive for leg swelling (baseline post knee surgery ). Negative for chest pain and palpitations.  Gastrointestinal: Negative for constipation, diarrhea, nausea and vomiting.  Endocrine: Negative.   Genitourinary: Negative.   Musculoskeletal: Negative.    Skin: Negative.   Neurological: Negative for dizziness and headaches.  Psychiatric/Behavioral: Negative.  Negative for dysphoric mood. The patient is not nervous/anxious.   All other systems reviewed and are negative.    Physical Exam  BP 126/74 (BP Location: Left Wrist, Cuff Size: Normal)   Pulse 77   Ht $R'5\' 11"'ku$  (1.803 m)   Wt 292 lb 8 oz (132.7 kg)   SpO2 97%   BMI 40.80 kg/m   Wt Readings from Last 5 Encounters:  09/05/20 292 lb 8 oz (132.7 kg)  08/01/20 297 lb 12.8 oz (135.1 kg)    BMI Readings from Last 5 Encounters:  09/05/20 40.80 kg/m  08/01/20  41.53 kg/m     Physical Exam Vitals and nursing note reviewed.  Constitutional:      General: He is not in acute distress.    Appearance: Normal appearance. He is obese.  HENT:     Head: Normocephalic and atraumatic.     Right Ear: Hearing and external ear normal.     Left Ear: Hearing and external ear normal.     Nose: Nose normal. No mucosal edema or rhinorrhea.     Right Turbinates: Not enlarged.     Left Turbinates: Not enlarged.     Mouth/Throat:     Mouth: Mucous membranes are dry.     Pharynx: Oropharynx is clear. No oropharyngeal exudate.  Eyes:     Pupils: Pupils are equal, round, and reactive to light.  Cardiovascular:     Rate and Rhythm: Normal rate and regular rhythm.     Pulses: Normal pulses.     Heart sounds: Normal heart sounds. No murmur heard.   Pulmonary:     Effort: Pulmonary effort is normal.     Breath sounds: Normal breath sounds. No decreased breath sounds, wheezing or rales.  Musculoskeletal:     Cervical back: Normal range of motion.     Right lower leg: No edema.     Left lower leg: No edema.  Lymphadenopathy:     Cervical: No cervical adenopathy.  Skin:    General: Skin is warm and dry.     Capillary Refill: Capillary refill takes less than 2 seconds.     Findings: No erythema or rash.  Neurological:     General: No focal deficit present.     Mental Status: He is alert and  oriented to person, place, and time.     Motor: No weakness.     Coordination: Coordination normal.     Gait: Gait is intact. Gait normal.  Psychiatric:        Mood and Affect: Mood normal.        Behavior: Behavior normal. Behavior is cooperative.        Thought Content: Thought content normal.        Judgment: Judgment normal.       Assessment & Plan:   Obstructive sleep apnea syndrome Severe obstructive sleep apnea with a well-controlled AHI based off of patient's compliance report on his phone Over the last 90 days average AHI was 2.5 2017 sleep study showed severe obstructive sleep apnea with an AHI 51, supine sleep AHI of 128  Plan: Continue CPAP therapy Follow-up in 3 months to establish with sleep MD/pulmonary provider  Former smoker Former smoker Quit 2016 40-pack-year smoking history  Plan: Referral to lung cancer screening program Will investigate cost of pulmonary function testing, patient would benefit from having a PFT performed  Healthcare maintenance Recommend COVID-19 vaccinations, patient declined Recommend seasonal flu vaccine, patient to receive today Should patient have emphysema on CT imaging or COPD based off of pulmonary function testing would also recommend Pneumovax 23    Return in about 3 months (around 12/04/2020), or if symptoms worsen or fail to improve, for [facility].   Lauraine Rinne, NP 09/05/2020   This appointment required 34 minutes of patient care (this includes precharting, chart review, review of results, face-to-face care, etc.).

## 2020-09-08 ENCOUNTER — Telehealth: Payer: Self-pay | Admitting: *Deleted

## 2020-09-08 ENCOUNTER — Encounter: Payer: Self-pay | Admitting: *Deleted

## 2020-09-08 DIAGNOSIS — Z122 Encounter for screening for malignant neoplasm of respiratory organs: Secondary | ICD-10-CM

## 2020-09-08 DIAGNOSIS — Z87891 Personal history of nicotine dependence: Secondary | ICD-10-CM

## 2020-09-08 NOTE — Telephone Encounter (Signed)
Received referral for initial lung cancer screening scan. Contacted patient and obtained smoking history,(former, quit 09/03/14, 30 pack year) as well as answering questions related to screening process. Patient denies signs of lung cancer such as weight loss or hemoptysis. Patient denies comorbidity that would prevent curative treatment if lung cancer were found. Patient is scheduled for shared decision making visit and CT scan on 09/15/20.

## 2020-09-11 DIAGNOSIS — R059 Cough, unspecified: Secondary | ICD-10-CM | POA: Diagnosis not present

## 2020-09-11 DIAGNOSIS — G4733 Obstructive sleep apnea (adult) (pediatric): Secondary | ICD-10-CM | POA: Diagnosis not present

## 2020-09-11 DIAGNOSIS — U071 COVID-19: Secondary | ICD-10-CM | POA: Diagnosis not present

## 2020-09-11 DIAGNOSIS — R0602 Shortness of breath: Secondary | ICD-10-CM | POA: Diagnosis not present

## 2020-09-11 DIAGNOSIS — R509 Fever, unspecified: Secondary | ICD-10-CM | POA: Diagnosis not present

## 2020-09-11 DIAGNOSIS — R519 Headache, unspecified: Secondary | ICD-10-CM | POA: Diagnosis not present

## 2020-09-11 DIAGNOSIS — Z87891 Personal history of nicotine dependence: Secondary | ICD-10-CM | POA: Diagnosis not present

## 2020-09-11 DIAGNOSIS — R0989 Other specified symptoms and signs involving the circulatory and respiratory systems: Secondary | ICD-10-CM | POA: Diagnosis not present

## 2020-09-15 ENCOUNTER — Ambulatory Visit: Payer: BC Managed Care – PPO

## 2020-09-15 ENCOUNTER — Inpatient Hospital Stay: Payer: BC Managed Care – PPO | Admitting: Nurse Practitioner

## 2020-09-15 ENCOUNTER — Other Ambulatory Visit: Payer: Self-pay

## 2020-09-26 ENCOUNTER — Other Ambulatory Visit: Payer: Self-pay

## 2020-09-26 ENCOUNTER — Telehealth: Payer: Self-pay

## 2020-09-26 DIAGNOSIS — Z1211 Encounter for screening for malignant neoplasm of colon: Secondary | ICD-10-CM

## 2020-09-26 NOTE — Telephone Encounter (Signed)
Patient lvm to inquire if it was too soon for him to have his Colonoscopy procedure since his Positive COVID test 09/11/20.  Colonoscopy is currently scheduled for Friday 09/30/20.  COVID test was performed at [facility]. I will send a copy to Trish in Endo and have it rescheduled to 10/28/20.  Patient has agreed to this also.   Thanks,  [place], [place]

## 2020-09-28 ENCOUNTER — Other Ambulatory Visit: Admission: RE | Admit: 2020-09-28 | Payer: BC Managed Care – PPO | Source: Ambulatory Visit

## 2020-09-29 ENCOUNTER — Telehealth: Payer: Self-pay | Admitting: Family Medicine

## 2020-09-29 NOTE — Telephone Encounter (Signed)
Called and left vm for the patient to call and schedule TOC appt per DPR. EM °

## 2020-10-12 NOTE — Telephone Encounter (Signed)
Prior appt cancelled due to patient illness. Rescheduled for 11/03/20 at 430pm

## 2020-10-25 ENCOUNTER — Telehealth: Payer: Self-pay

## 2020-10-25 NOTE — Telephone Encounter (Signed)
Returned patients call. Pt states he lost his instructions and needed to know where to go for his COVID test. Explained location to pt. Pt verbalized understanding.

## 2020-10-26 ENCOUNTER — Other Ambulatory Visit: Admission: RE | Admit: 2020-10-26 | Payer: BC Managed Care – PPO | Source: Ambulatory Visit

## 2020-10-28 ENCOUNTER — Ambulatory Visit: Payer: BC Managed Care – PPO | Admitting: Anesthesiology

## 2020-10-28 ENCOUNTER — Encounter
Admission: RE | Disposition: A | Discharge status: Home / Self Care | Payer: Self-pay | Source: Home / Self Care | Attending: Gastroenterology

## 2020-10-28 ENCOUNTER — Encounter: Payer: Self-pay | Admitting: Gastroenterology

## 2020-10-28 ENCOUNTER — Ambulatory Visit
Admission: RE | Admit: 2020-10-28 | Discharge: 2020-10-28 | Disposition: A | Discharge status: Home / Self Care | Payer: BC Managed Care – PPO | Attending: Gastroenterology | Admitting: Gastroenterology

## 2020-10-28 ENCOUNTER — Other Ambulatory Visit: Payer: Self-pay

## 2020-10-28 DIAGNOSIS — D122 Benign neoplasm of ascending colon: Secondary | ICD-10-CM | POA: Diagnosis not present

## 2020-10-28 DIAGNOSIS — Z1211 Encounter for screening for malignant neoplasm of colon: Secondary | ICD-10-CM | POA: Diagnosis not present

## 2020-10-28 DIAGNOSIS — Z885 Allergy status to narcotic agent status: Secondary | ICD-10-CM | POA: Insufficient documentation

## 2020-10-28 DIAGNOSIS — Z880 Allergy status to penicillin: Secondary | ICD-10-CM | POA: Insufficient documentation

## 2020-10-28 DIAGNOSIS — D125 Benign neoplasm of sigmoid colon: Secondary | ICD-10-CM | POA: Insufficient documentation

## 2020-10-28 DIAGNOSIS — K635 Polyp of colon: Secondary | ICD-10-CM | POA: Diagnosis not present

## 2020-10-28 DIAGNOSIS — D126 Benign neoplasm of colon, unspecified: Secondary | ICD-10-CM | POA: Diagnosis not present

## 2020-10-28 DIAGNOSIS — Z87891 Personal history of nicotine dependence: Secondary | ICD-10-CM | POA: Insufficient documentation

## 2020-10-28 HISTORY — PX: COLONOSCOPY WITH PROPOFOL: SHX5780

## 2020-10-28 SURGERY — COLONOSCOPY WITH PROPOFOL
Anesthesia: General

## 2020-10-28 MED ORDER — SODIUM CHLORIDE 0.9 % IV SOLN: INTRAVENOUS | Status: DC

## 2020-10-28 MED ORDER — PROPOFOL 10 MG/ML IV BOLUS
INTRAVENOUS | Status: DC | PRN
  Administered 2020-10-28: 100 mg via INTRAVENOUS

## 2020-10-28 MED ORDER — PROPOFOL 500 MG/50ML IV EMUL
INTRAVENOUS | Status: DC | PRN
  Administered 2020-10-28: 150 ug/kg/min via INTRAVENOUS

## 2020-10-28 MED ORDER — PROPOFOL 500 MG/50ML IV EMUL
INTRAVENOUS | Status: AC
  Filled 2020-10-28: qty 50

## 2020-10-28 MED ORDER — LIDOCAINE HCL (CARDIAC) PF 100 MG/5ML IV SOSY
PREFILLED_SYRINGE | INTRAVENOUS | Status: DC | PRN
  Administered 2020-10-28: 40 mg via INTRAVENOUS

## 2020-10-28 NOTE — Op Note (Signed)
[facility] Gastroenterology Patient Name: [name] Procedure Date: 10/28/2020 10:12 AM MRN: [id number] Account #: [id number] Date of Birth: 02/20/1966 Admit Type: Outpatient Age: 55 Room: [facility] ENDO ROOM 4 Gender: Male Note Status: Finalized Procedure:             Colonoscopy Indications:           Screening for colorectal malignant neoplasm Providers:             [name] MD, MD Referring MD:          Elby Beck (Referring MD) Medicines:             Monitored Anesthesia Care Complications:         No immediate complications. Procedure:             Pre-Anesthesia Assessment:                        - Prior to the procedure, a History and Physical was                         performed, and patient medications, allergies and                         sensitivities were reviewed. The patient's tolerance                         of previous anesthesia was reviewed.                        - The risks and benefits of the procedure and the                         sedation options and risks were discussed with the                         patient. All questions were answered and informed                         consent was obtained.                        - ASA Grade Assessment: II - A patient with mild                         systemic disease.                        After obtaining informed consent, the colonoscope was                         passed under direct vision. Throughout the procedure,                         the patient's blood pressure, pulse, and oxygen                         saturations were monitored continuously. The                         Colonoscope was introduced through the anus and  advanced to the the cecum, identified by the                         appendiceal orifice. The colonoscopy was performed                         with ease. The patient tolerated the procedure well.                         The quality of  the bowel preparation was excellent. Findings:      The perianal and digital rectal examinations were normal.      A 3 mm polyp was found in the cecum. The polyp was sessile. The polyp       was removed with a cold biopsy forceps. Resection and retrieval were       complete.      Two sessile polyps were found in the ascending colon. The polyps were 4       to 5 mm in size. These polyps were removed with a cold snare. Resection       and retrieval were complete.      A 5 mm polyp was found in the sigmoid colon. The polyp was sessile. The       polyp was removed with a cold snare. Resection and retrieval were       complete.      The exam was otherwise without abnormality on direct and retroflexion       views. Impression:            - One 3 mm polyp in the cecum, removed with a cold                         biopsy forceps. Resected and retrieved.                        - Two 4 to 5 mm polyps in the ascending colon, removed                         with a cold snare. Resected and retrieved.                        - One 5 mm polyp in the sigmoid colon, removed with a                         cold snare. Resected and retrieved.                        - The examination was otherwise normal on direct and                         retroflexion views. Recommendation:        - Discharge patient to home (with escort).                        - Resume previous diet.                        - Continue present medications.                        -  Await pathology results.                        - Repeat colonoscopy for surveillance based on                         pathology results. Procedure Code(s):     --- Professional ---                        [phone], Colonoscopy, flexible; with removal of                         tumor(s), polyp(s), or other lesion(s) by snare                         technique                        [id number], 22, Colonoscopy, flexible; with biopsy, single                         or  multiple Diagnosis Code(s):     --- Professional ---                        K63.5, Polyp of colon                        Z12.11, Encounter for screening for malignant neoplasm                         of colon CPT copyright 2019 American Medical Association. All rights reserved. The codes documented in this report are preliminary and upon coder review may  be revised to meet current compliance requirements. [name], MD [name] MD, MD 10/28/2020 10:36:22 AM This report has been signed electronically. Number of Addenda: 0 Note Initiated On: 10/28/2020 10:12 AM Scope Withdrawal Time: 0 hours 13 minutes 53 seconds  Total Procedure Duration: 0 hours 15 minutes 11 seconds  Estimated Blood Loss:  Estimated blood loss: none.      [facility]

## 2020-10-28 NOTE — H&P (Signed)
[name], MD [address] [place], [place], [place], [address] [address], [place], [place], [place], [address] Phone: [phone]  Fax: [phone]  Primary Care Physician:  Elby Beck, FNP (Inactive)   Pre-Procedure History & Physical: HPI:  [name] is a 55 y.o. male is here for an colonoscopy.   Past Medical History:  Diagnosis Date  . Sleep apnea     Past Surgical History:  Procedure Laterality Date  . BILATERAL CARPAL TUNNEL RELEASE Bilateral 2021  . JOINT REPLACEMENT Bilateral 2016  . REVISION TOTAL KNEE ARTHROPLASTY Left 2020  . SPINE SURGERY  2007   Fusion L5    Prior to Admission medications   Medication Sig Start Date End Date Taking? Authorizing Provider  Celecoxib (CELEBREX PO) Take by mouth daily. Patient not taking: Reported on 10/28/2020    [provider]    Allergies as of 08/05/2020 - Review Complete 08/05/2020  Allergen Reaction Noted  . Penicillins Other (See Comments) 08/05/2020  . Oxycodone  04/23/2019    Family History  Problem Relation Age of Onset  . Asthma Father   . Diabetes Father     Social History   Socioeconomic History  . Marital status: Married    Spouse name: Not on file  . Number of children: Not on file  . Years of education: 52  . Highest education level: High school graduate  Occupational History  . Not on file  Tobacco Use  . Smoking status: Former Smoker    Packs/day: 1.00    Years: 30.00    Pack years: 30.00    Types: Cigarettes    Quit date: 2016    Years since quitting: 6.1  . Smokeless tobacco: Never Used  Vaping Use  . Vaping Use: Never used  Substance and Sexual Activity  . Alcohol use: Not Currently  . Drug use: Never  . Sexual activity: Yes  Other Topics Concern  . 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 Strong faith.     Social Determinants of Health   Financial Resource Strain: Not on file  Food Insecurity: Not on file  Transportation Needs: Not on file  Physical Activity: Not on file  Stress: Not on file  Social Connections: Not on file  Intimate Partner Violence: Not on file    Review of Systems: See HPI, otherwise negative ROS  Physical Exam: BP 121/80   Pulse 79   Temp 98 F (36.7 C) (Temporal)   Resp 18   Ht 5\' 11"  (1.803 m)   Wt 122.5 kg   SpO2 97%   BMI 37.66 kg/m  General:   Alert,  pleasant and cooperative in NAD Head:  Normocephalic and atraumatic. Neck:  Supple; no masses or thyromegaly. Lungs:  Clear throughout to auscultation, normal respiratory effort.    Heart:  +S1, +S2, Regular rate and rhythm, No edema. Abdomen:  Soft, nontender and nondistended. Normal bowel sounds, without guarding, and without rebound.   Neurologic:  Alert and  oriented x4;  grossly normal neurologically.  Impression/Plan: [name] is here for an colonoscopy to be performed for Screening colonoscopy average risk   Risks, benefits, limitations, and alternatives regarding  colonoscopy have been reviewed with the patient.  Questions have been answered.  All parties agreeable.   [name], MD  10/28/2020, 10:10 AM

## 2020-10-28 NOTE — Transfer of Care (Signed)
Immediate Anesthesia Transfer of Care Note  Patient: [name]  Procedure(s) Performed: Procedure(s) with comments: COLONOSCOPY WITH PROPOFOL (N/A) - COVID POSITIVE 09/11/2020  Patient Location: PACU and Endoscopy Unit  Anesthesia Type:General  Level of Consciousness: sedated  Airway & Oxygen Therapy: Patient Spontanous Breathing and Patient connected to nasal cannula oxygen  Post-op Assessment: Report given to RN and Post -op Vital signs reviewed and stable  Post vital signs: Reviewed and stable  Last Vitals:  Vitals:   10/28/20 0917 10/28/20 1039  BP: 121/80 111/70  Pulse: 79   Resp: 18 (!) 22  Temp: 36.7 C 36.6 C  SpO2: 45%     Complications: No apparent anesthesia complications

## 2020-10-28 NOTE — Anesthesia Preprocedure Evaluation (Signed)
Anesthesia Evaluation  Patient identified by MRN, date of birth, ID band Patient awake    Reviewed: Allergy & Precautions, NPO status , Patient's Chart, lab work & pertinent test results  Airway Mallampati: II  TM Distance: >3 FB     Dental   Pulmonary sleep apnea , former smoker,    Pulmonary exam normal        Cardiovascular negative cardio ROS Normal cardiovascular exam     Neuro/Psych negative neurological ROS  negative psych ROS   GI/Hepatic negative GI ROS, Neg liver ROS,   Endo/Other  negative endocrine ROS  Renal/GU negative Renal ROS  negative genitourinary   Musculoskeletal  (+) Arthritis , Osteoarthritis,    Abdominal   Peds negative pediatric ROS (+)  Hematology negative hematology ROS (+)   Anesthesia Other Findings Past Medical History: No date: Sleep apnea  Reproductive/Obstetrics                             Anesthesia Physical Anesthesia Plan  ASA: II  Anesthesia Plan: General   Post-op Pain Management:    Induction: Intravenous  PONV Risk Score and Plan: Propofol infusion  Airway Management Planned: Nasal Cannula  Additional Equipment:   Intra-op Plan:   Post-operative Plan:   Informed Consent: I have reviewed the patients History and Physical, chart, labs and discussed the procedure including the risks, benefits and alternatives for the proposed anesthesia with the patient or authorized representative who has indicated his/her understanding and acceptance.     Dental advisory given  Plan Discussed with: CRNA and Surgeon  Anesthesia Plan Comments:         Anesthesia Quick Evaluation

## 2020-10-28 NOTE — Anesthesia Procedure Notes (Signed)
Date/Time: 10/28/2020 10:15 AM Performed by: Doreen Salvage, CRNA Pre-anesthesia Checklist: Patient identified, Emergency Drugs available, Suction available and Patient being monitored Patient Re-evaluated:Patient Re-evaluated prior to induction Oxygen Delivery Method: Supernova nasal CPAP Induction Type: IV induction Dental Injury: Teeth and Oropharynx as per pre-operative assessment  Comments: Nasal cannula with etCO2 monitoring

## 2020-10-30 NOTE — Anesthesia Postprocedure Evaluation (Signed)
Anesthesia Post Note  Patient: [name]  Procedure(s) Performed: COLONOSCOPY WITH PROPOFOL (N/A )  Patient location during evaluation: Endoscopy Anesthesia Type: General Level of consciousness: awake and alert and oriented Pain management: pain level controlled Vital Signs Assessment: post-procedure vital signs reviewed and stable Respiratory status: spontaneous breathing Cardiovascular status: blood pressure returned to baseline Anesthetic complications: no   No complications documented.   Last Vitals:  Vitals:   10/28/20 0917 10/28/20 1039  BP: 121/80 111/70  Pulse: 79   Resp: 18 (!) 22  Temp: 36.7 C 36.6 C  SpO2: 97%     Last Pain:  Vitals:   10/28/20 1109  TempSrc:   PainSc: 0-No pain                 CARROLL,PAUL

## 2020-10-31 ENCOUNTER — Encounter: Payer: Self-pay | Admitting: Gastroenterology

## 2020-10-31 LAB — SURGICAL PATHOLOGY

## 2020-11-01 ENCOUNTER — Encounter: Payer: Self-pay | Admitting: Gastroenterology

## 2020-11-01 ENCOUNTER — Encounter: Payer: BC Managed Care – PPO | Admitting: Family Medicine

## 2020-11-01 ENCOUNTER — Encounter: Payer: Self-pay | Admitting: Family Medicine

## 2020-11-02 ENCOUNTER — Encounter: Payer: BC Managed Care – PPO | Admitting: Family Medicine

## 2020-11-03 ENCOUNTER — Inpatient Hospital Stay: Payer: BC Managed Care – PPO | Attending: Oncology | Admitting: Oncology

## 2020-11-03 ENCOUNTER — Ambulatory Visit
Admission: RE | Admit: 2020-11-03 | Discharge: 2020-11-03 | Disposition: A | Discharge status: Home / Self Care | Payer: BC Managed Care – PPO | Source: Ambulatory Visit | Attending: Nurse Practitioner | Admitting: Nurse Practitioner

## 2020-11-03 ENCOUNTER — Other Ambulatory Visit: Payer: Self-pay

## 2020-11-03 DIAGNOSIS — Z122 Encounter for screening for malignant neoplasm of respiratory organs: Secondary | ICD-10-CM | POA: Insufficient documentation

## 2020-11-03 DIAGNOSIS — Z87891 Personal history of nicotine dependence: Secondary | ICD-10-CM | POA: Diagnosis not present

## 2020-11-03 NOTE — Progress Notes (Signed)
Virtual Visit via Video Note  I connected with Mr. Olenik on 11/03/20 at  3:30 PM EST by a video enabled telemedicine application and verified that I am speaking with the correct person using two identifiers.  Location: Patient: Home Provider: Clinic    I discussed the limitations of evaluation and management by telemedicine and the availability of in person appointments. The patient expressed understanding and agreed to proceed.  I discussed the assessment and treatment plan with the patient. The patient was provided an opportunity to ask questions and all were answered. The patient agreed with the plan and demonstrated an understanding of the instructions.   The patient was advised to call back or seek an in-person evaluation if the symptoms worsen or if the condition fails to improve as anticipated.   In accordance with CMS guidelines, patient has met eligibility criteria including age, absence of signs or symptoms of lung cancer.  Social History   Tobacco Use  . Smoking status: Former Smoker    Packs/day: 1.00    Years: 30.00    Pack years: 30.00    Types: Cigarettes    Quit date: 2016    Years since quitting: 6.1  . Smokeless tobacco: Never Used  Vaping Use  . Vaping Use: Never used  Substance Use Topics  . Alcohol use: Not Currently  . Drug use: Never      A shared decision-making session was conducted prior to the performance of CT scan. This includes one or more decision aids, includes benefits and harms of screening, follow-up diagnostic testing, over-diagnosis, false positive rate, and total radiation exposure.   Counseling on the importance of adherence to annual lung cancer LDCT screening, impact of co-morbidities, and ability or willingness to undergo diagnosis and treatment is imperative for compliance of the program.   Counseling on the importance of continued smoking cessation for former smokers; the importance of smoking cessation for current smokers, and  information about tobacco cessation interventions have been given to patient including [place] and [facility] [place] programs.   Written order for lung cancer screening with LDCT has been given to the patient and any and all questions have been answered to the best of my abilities.    Yearly follow up will be coordinated by Burgess Estelle, Thoracic Navigator.  I provided 15 minutes of face-to-face video visit time during this encounter, and > 50% was spent counseling as documented under my assessment & plan.   Jacquelin Hawking, NP

## 2020-11-10 ENCOUNTER — Encounter: Payer: Self-pay | Admitting: *Deleted

## 2020-11-13 ENCOUNTER — Telehealth: Payer: Self-pay | Admitting: Family Medicine

## 2020-11-13 DIAGNOSIS — J439 Emphysema, unspecified: Secondary | ICD-10-CM

## 2020-11-13 DIAGNOSIS — R911 Solitary pulmonary nodule: Secondary | ICD-10-CM

## 2020-11-13 DIAGNOSIS — K760 Fatty (change of) liver, not elsewhere classified: Secondary | ICD-10-CM

## 2020-11-13 NOTE — Telephone Encounter (Signed)
  Notify patient.  1.  Benign-appearing lung nodules noted.  Continue annual screening with low-dose chest CT without contrast in 12 months.  He should get a phone call about that next year.  If he does not then please have him call back in March of next year.  Other incidental findings. 2.  Gallstones. 3.  Fatty liver. 4. Emphysema.    Routed to Dr. Einar Pheasant as Juluis Rainier as patient has transfer of care appointment pending.

## 2020-11-14 DIAGNOSIS — R911 Solitary pulmonary nodule: Secondary | ICD-10-CM | POA: Insufficient documentation

## 2020-11-14 DIAGNOSIS — K760 Fatty (change of) liver, not elsewhere classified: Secondary | ICD-10-CM | POA: Insufficient documentation

## 2020-11-14 DIAGNOSIS — J439 Emphysema, unspecified: Secondary | ICD-10-CM | POA: Insufficient documentation

## 2020-11-14 NOTE — Telephone Encounter (Signed)
LMTCB

## 2020-11-14 NOTE — Telephone Encounter (Signed)
Please call the patient back. EM

## 2020-11-14 NOTE — Telephone Encounter (Signed)
Noted  

## 2020-11-15 ENCOUNTER — Ambulatory Visit: Payer: BC Managed Care – PPO | Admitting: Family Medicine

## 2020-11-15 NOTE — Telephone Encounter (Signed)
LMTCB

## 2020-11-15 NOTE — Telephone Encounter (Signed)
Patient aware of results and verbalized understanding. Patient will call back next year for next scan to be done.

## 2020-11-17 ENCOUNTER — Ambulatory Visit: Payer: BC Managed Care – PPO | Admitting: Family Medicine

## 2020-11-17 ENCOUNTER — Other Ambulatory Visit: Payer: Self-pay

## 2020-11-17 ENCOUNTER — Encounter: Payer: Self-pay | Admitting: Family Medicine

## 2020-11-17 VITALS — BP 124/68 | HR 72 | Temp 96.9°F | Wt 287.5 lb

## 2020-11-17 DIAGNOSIS — K8080 Other cholelithiasis without obstruction: Secondary | ICD-10-CM

## 2020-11-17 DIAGNOSIS — K760 Fatty (change of) liver, not elsewhere classified: Secondary | ICD-10-CM | POA: Diagnosis not present

## 2020-11-17 DIAGNOSIS — R911 Solitary pulmonary nodule: Secondary | ICD-10-CM

## 2020-11-17 DIAGNOSIS — K8020 Calculus of gallbladder without cholecystitis without obstruction: Secondary | ICD-10-CM | POA: Insufficient documentation

## 2020-11-17 DIAGNOSIS — J439 Emphysema, unspecified: Secondary | ICD-10-CM | POA: Diagnosis not present

## 2020-11-17 DIAGNOSIS — R519 Headache, unspecified: Secondary | ICD-10-CM | POA: Diagnosis not present

## 2020-11-17 DIAGNOSIS — G8929 Other chronic pain: Secondary | ICD-10-CM

## 2020-11-17 MED ORDER — ALBUTEROL SULFATE HFA 108 (90 BASE) MCG/ACT IN AERS: 2.0000 | INHALATION_SPRAY | Freq: Four times a day (QID) | RESPIRATORY_TRACT | 2 refills | Status: DC | PRN

## 2020-11-17 NOTE — Patient Instructions (Addendum)
Curcumin or Tumeric  Research has shown that Curcumin (Tumeric) supplements are just as good as high dose anti-inflammatory medications (like diclofenac and ibuprofen) at treating pain from osteoarthritis without the side effects.   Take Curcumin 500 mg Three times daily   -- You can find a bottle at [employer] for $8 which should last a month -- [place] is around $9 and is available at [employer]   Lung nodules  - very small - repeat imaging next year - low risk overall  Gallstones - if right side belly pain, call or make appointment  Fatty liver  - work weight loss - regular exercise and healthy diet  Emphysema - albuterol inhaler for wheezing and shortness of breath - call if breathing is worsening  Headaches - drink water - see if improved with inhaler use - return if not getting better in next month

## 2020-11-17 NOTE — Assessment & Plan Note (Signed)
Notes persistent HA since covid in jan 2022. Advised hydration, treat sleep apnea and albuterol prn. If persisting for 1 more month f/u here or at covid clinic

## 2020-11-17 NOTE — Assessment & Plan Note (Signed)
Trial of albuterol as notes some wheezing occasionally. Return if needing frequently.

## 2020-11-17 NOTE — Assessment & Plan Note (Signed)
<  3.5 mm, low risk overall. F/u ct 1 year.

## 2020-11-17 NOTE — Assessment & Plan Note (Signed)
On CT imaging. Discussed healthy diet and exercise and weight loss as main treatment.

## 2020-11-17 NOTE — Assessment & Plan Note (Signed)
Asymptomatic. Discussed symptoms associated with disease and when to f/u. Continue to monitor

## 2020-11-17 NOTE — Progress Notes (Signed)
Subjective:     [name] is a 55 y.o. male presenting for Review CT Results     HPI  #Lung CT for cancer screening - hx of covid - has noticed more shallow breathing since covid  #Headaches - notes frequent over the last few months - started after covid in January   Review of Systems   Social History   Tobacco Use  Smoking Status Former Smoker  . Packs/day: 1.00  . Years: 30.00  . Pack years: 30.00  . Types: Cigarettes  . Quit date: 2016  . Years since quitting: 6.2  Smokeless Tobacco Never Used        Objective:    BP Readings from Last 3 Encounters:  11/17/20 124/68  10/28/20 111/70  09/05/20 126/74   Wt Readings from Last 3 Encounters:  11/17/20 287 lb 8 oz (130.4 kg)  11/03/20 270 lb (122.5 kg)  10/28/20 270 lb (122.5 kg)    BP 124/68 (BP Location: Left Arm, Patient Position: Sitting, Cuff Size: Large)   Pulse 72   Temp (!) 96.9 F (36.1 C) (Temporal)   Wt 287 lb 8 oz (130.4 kg)   SpO2 96%   BMI 40.10 kg/m    Physical Exam Constitutional:      Appearance: Normal appearance. He is not ill-appearing or diaphoretic.  HENT:     Right Ear: External ear normal.     Left Ear: External ear normal.     Nose: Nose normal.  Eyes:     General: No scleral icterus.    Extraocular Movements: Extraocular movements intact.     Conjunctiva/sclera: Conjunctivae normal.  Cardiovascular:     Rate and Rhythm: Normal rate and regular rhythm.     Heart sounds: No murmur heard.   Pulmonary:     Effort: Pulmonary effort is normal. No respiratory distress.     Breath sounds: Normal breath sounds. No wheezing.  Musculoskeletal:     Cervical back: Neck supple.  Skin:    General: Skin is warm and dry.  Neurological:     Mental Status: He is alert. Mental status is at baseline.  Psychiatric:        Mood and Affect: Mood normal.     CT CHEST LUNG CANCER SCREENING LOW DOSE WO CONTRAST CLINICAL DATA:  55 year old male with 30 pack-year history  of smoking. Lung cancer screening.  EXAM: CT CHEST WITHOUT CONTRAST LOW-DOSE FOR LUNG CANCER SCREENING  TECHNIQUE: Multidetector CT imaging of the chest was performed following the standard protocol without IV contrast.  COMPARISON:  None.  FINDINGS: Cardiovascular: The heart size is normal. No substantial pericardial effusion. No thoracic aortic aneurysm.  Mediastinum/Nodes: No mediastinal lymphadenopathy. No evidence for gross hilar lymphadenopathy although assessment is limited by the lack of intravenous contrast on today's study. The esophagus has normal imaging features. There is no axillary lymphadenopathy.  Lungs/Pleura: Centrilobular emphsyema noted. Scattered tiny bilateral pulmonary nodules are identified measuring up to maximum volume derived equivalent diameter of 3.5 mm. No suspicious pulmonary nodule or mass. No focal airspace consolidation. No pleural effusion.  Upper Abdomen: The liver shows diffusely decreased attenuation suggesting fat deposition. Calcified gallstone evident.  Musculoskeletal: No worrisome lytic or sclerotic osseous abnormality.  IMPRESSION: 1. Lung-RADS 2, benign appearance or behavior. Continue annual screening with low-dose chest CT without contrast in 12 months. 2. Cholelithiasis. 3. Hepatic steatosis. 4. Emphysema (ICD10-J43.9).  Electronically Signed   By: Misty Stanley M.D.   On: 11/04/2020 16:39  Assessment & Plan:   Problem List Items Addressed This Visit      Respiratory   Emphysema lung ([place]) - Primary    Trial of albuterol as notes some wheezing occasionally. Return if needing frequently.       Relevant Medications   albuterol (VENTOLIN HFA) 108 (90 Base) MCG/ACT inhaler     Digestive   Fatty liver    On CT imaging. Discussed healthy diet and exercise and weight loss as main treatment.       Cholelithiasis    Asymptomatic. Discussed symptoms associated with disease and when to f/u. Continue to  monitor        Other   Lung nodule    <3.5 mm, low risk overall. F/u ct 1 year.       Chronic nonintractable headache    Notes persistent HA since covid in jan 2022. Advised hydration, treat sleep apnea and albuterol prn. If persisting for 1 more month f/u here or at covid clinic          Return if symptoms worsen or fail to improve.  Lesleigh Noe, MD  This visit occurred during the SARS-CoV-2 public health emergency.  Safety protocols were in place, including screening questions prior to the visit, additional usage of staff PPE, and extensive cleaning of exam room while observing appropriate contact time as indicated for disinfecting solutions.

## 2020-12-27 ENCOUNTER — Telehealth: Payer: Self-pay

## 2020-12-27 NOTE — Progress Notes (Signed)
_0  ID: Wanita Chamberlain, male    DOB: Jul 05, 1966, 55 y.o.   MRN: [id number]  Chief Complaint  Patient presents with  . Follow-up    OSA - 5 hours everynight, tired throughout the  day.    Referring provider: No ref. provider found  HPI: 55 year old male, former smoker quit in 2016 (30-pack-year history).  Past medical history significant for emphysema, obstructive sleep apnea.  Seen for initial sleep consult by Wyn Quaker, NP on 09/05/2020.  Sleep study in 2017 showed severe obstructive sleep apnea with average AHI 51/hr (supine position AHI 128).  Referred to lung cancer screening program ordered for PFTs.  12/28/2020 Patient presents today for 69-monthfollow-up. Patient has a history of severe obstructive sleep apnea. He is compliant with CPAP. Uses full face, its been > 6 months since he changed. He is having a lot of airleaks. [place] He reports shallow breathing and feels he runs out of air with activity. He mows his lawn wearing a mask. He uses albuterol every other day without significant improvement.     Airview download 11/28/20- 12/27/20 28/30 days (93%); 93% > 4 hours Average usage 5 hours 41 mins Pressure 13cm h20 Airleaks 77.5L/min AHI 0.8  Imaging:  Low-dose CT chest on 11/04/2020 showed lung RADS 2, benign appearance of behavior of lung nodules.  Continue annual screening with low-dose CT chest.  Scattered tiny bilateral pulmonary nodules measuring up to maximum 3.5 mm.  Centrilobular emphysema noted.  Allergies  Allergen Reactions  . Penicillins Other (See Comments)  . Oxycodone     Immunization History  Administered Date(s) Administered  . Hepatitis B, adult 02/05/2017, 03/13/2017, 08/07/2017  . Influenza,inj,Quad PF,6+ Mos 09/05/2020    Past Medical History:  Diagnosis Date  . Sleep apnea     Tobacco History: Social History   Tobacco Use  Smoking Status Former Smoker  . Packs/day: 1.00  . Years: 30.00  . Pack years: 30.00  .  Types: Cigarettes  . Quit date: 2016  . Years since quitting: 6.4  Smokeless Tobacco Never Used   Counseling given: Not Answered   Outpatient Medications Prior to Visit  Medication Sig Dispense Refill  . albuterol (VENTOLIN HFA) 108 (90 Base) MCG/ACT inhaler Inhale 2 puffs into the lungs every 6 (six) hours as needed for wheezing or shortness of breath. 8 g 2  . Celecoxib (CELEBREX PO) Take by mouth daily. (Patient not taking: No sig reported)     No facility-administered medications prior to visit.    Review of Systems  Review of Systems  Constitutional: Negative.   HENT: Negative.   Respiratory: Positive for shortness of breath.   Cardiovascular: Negative.     Physical Exam  BP 114/78 (BP Location: Left Arm, Patient Position: Sitting, Cuff Size: Normal)   Pulse 74   Temp 98.2 F (36.8 C) (Temporal)   Ht _1  (1.803 m)   Wt 293 lb 9.6 oz (133.2 kg)   SpO2 98%   BMI 40.95 kg/m  Physical Exam Constitutional:      Appearance: Normal appearance.  HENT:     Head: Normocephalic and atraumatic.  Cardiovascular:     Rate and Rhythm: Normal rate and regular rhythm.  Pulmonary:     Effort: Pulmonary effort is normal.     Breath sounds: Normal breath sounds.  Musculoskeletal:        General: Normal range of motion.  Skin:    General: Skin is warm and dry.  Neurological:  General: No focal deficit present.     Mental Status: He is alert and oriented to person, place, and time. Mental status is at baseline.  Psychiatric:        Mood and Affect: Mood normal.        Behavior: Behavior normal.        Thought Content: Thought content normal.        Judgment: Judgment normal.      Lab Results:  CBC    Component Value Date/Time   WBC 8.2 08/01/2020 1118   RBC 5.35 08/01/2020 1118   HGB 16.5 08/01/2020 1118   HCT 47.6 08/01/2020 1118   PLT 214.0 08/01/2020 1118   MCV 89.0 08/01/2020 1118   MCHC 34.5 08/01/2020 1118   RDW 14.0 08/01/2020 1118   LYMPHSABS 2.3  08/01/2020 1118   MONOABS 0.5 08/01/2020 1118   EOSABS 0.4 08/01/2020 1118   BASOSABS 0.1 08/01/2020 1118    BMET    Component Value Date/Time   NA 137 08/01/2020 1118   K 4.0 08/01/2020 1118   CL 99 08/01/2020 1118   CO2 30 08/01/2020 1118   GLUCOSE 127 (H) 08/01/2020 1118   BUN 10 08/01/2020 1118   CREATININE 1.05 08/01/2020 1118   CALCIUM 9.8 08/01/2020 1118   GFRNONAA >60 05/09/2007 0410   GFRAA  05/09/2007 0410    >60        The eGFR has been calculated using the MDRD equation. This calculation has not been validated in all clinical    BNP No results found for: BNP  ProBNP No results found for: PROBNP  Imaging: Pulmonary Function Test [facility] Only  Result Date: 01/12/2021 Spirometry Data Is Acceptable and Reproducible No obvious evidence of Obstructive Airways disease or Restrictive Lung disease Consider outpatient follow up with Pulmonary if needed. Clinical Correlation Advised     Assessment & Plan:   Obstructive sleep apnea syndrome - Sleep study in 2017 showed severe obstructive sleep apnea with average AHI 51/hr (supine position AHI 128). Patient is 93% compliant with CPAP. He is having a large moderate to large amount airleaks. Pressure 13cm h20; residual AHI 0.8. Advised he change out CPAP mask for new one. Continue to use CPAP every night for 4-6 hours or more. He is not to drive if experiencing excessive daytime fatigue or somnolence  Lung nodule - Low-dose CT chest on 11/04/2020 showed lung RADS 2, benign appearance of behavior of lung nodules.  Continue annual screening with low-dose CT chest.  Scattered tiny bilateral pulmonary nodules measuring up to maximum 3.5 mm.  Centrilobular emphysema noted.   Emphysema lung ([place]) - Patient reports dyspnea with exertion/shallow breathing  - Advised he use albuterol rescue inahler 2 puffs every 6 hours for shortness of breath/wheezing - Follow-up 4-8 weeks after PFTs     Martyn Ehrich, NP 02/06/2021

## 2020-12-27 NOTE — Telephone Encounter (Signed)
LVM on 12/27/2020 in regards to his appt on 12/28/2020.

## 2020-12-28 ENCOUNTER — Ambulatory Visit: Payer: BC Managed Care – PPO | Admitting: Primary Care

## 2020-12-28 ENCOUNTER — Telehealth: Payer: Self-pay

## 2020-12-28 ENCOUNTER — Other Ambulatory Visit: Payer: Self-pay

## 2020-12-28 ENCOUNTER — Encounter: Payer: Self-pay | Admitting: Primary Care

## 2020-12-28 VITALS — BP 114/78 | HR 74 | Temp 98.2°F | Ht 71.0 in | Wt 293.6 lb

## 2020-12-28 DIAGNOSIS — R911 Solitary pulmonary nodule: Secondary | ICD-10-CM

## 2020-12-28 DIAGNOSIS — R0602 Shortness of breath: Secondary | ICD-10-CM | POA: Diagnosis not present

## 2020-12-28 DIAGNOSIS — J439 Emphysema, unspecified: Secondary | ICD-10-CM

## 2020-12-28 DIAGNOSIS — G4733 Obstructive sleep apnea (adult) (pediatric): Secondary | ICD-10-CM

## 2020-12-28 NOTE — Patient Instructions (Signed)
Nice seeing you today No changes to CPAP pressure setting today, great work wearing your mask   Recommendations: - Change out CPAP mask to new one - Continue to use CPAP every night for 4-6 hours or more - Do not drive if experiencing excessive daytime fatigue or somnolence - Use albuterol rescue inahler 2 puffs every 6 hours for shortness of breath/wheezing  Orders: PFTs re: dyspnea   Follow-up 4-8 weeks after PFTs

## 2020-12-28 NOTE — Telephone Encounter (Signed)
Created in Error

## 2021-01-05 ENCOUNTER — Telehealth: Payer: Self-pay

## 2021-01-05 NOTE — Telephone Encounter (Signed)
Patient is aware of date/time of covid test prior to PFT.  

## 2021-01-10 ENCOUNTER — Other Ambulatory Visit
Admission: RE | Admit: 2021-01-10 | Discharge: 2021-01-10 | Disposition: A | Discharge status: Home / Self Care | Payer: BC Managed Care – PPO | Source: Ambulatory Visit | Attending: Primary Care | Admitting: Primary Care

## 2021-01-10 ENCOUNTER — Other Ambulatory Visit: Payer: Self-pay

## 2021-01-10 DIAGNOSIS — Z01812 Encounter for preprocedural laboratory examination: Secondary | ICD-10-CM | POA: Diagnosis not present

## 2021-01-10 DIAGNOSIS — Z20822 Contact with and (suspected) exposure to covid-19: Secondary | ICD-10-CM | POA: Insufficient documentation

## 2021-01-11 ENCOUNTER — Ambulatory Visit: Payer: BC Managed Care – PPO | Attending: Primary Care

## 2021-01-11 DIAGNOSIS — Z87891 Personal history of nicotine dependence: Secondary | ICD-10-CM | POA: Diagnosis not present

## 2021-01-11 DIAGNOSIS — R0602 Shortness of breath: Secondary | ICD-10-CM | POA: Insufficient documentation

## 2021-01-11 DIAGNOSIS — R059 Cough, unspecified: Secondary | ICD-10-CM | POA: Insufficient documentation

## 2021-01-11 LAB — SARS CORONAVIRUS 2 (TAT 6-24 HRS): SARS Coronavirus 2: NEGATIVE

## 2021-01-20 NOTE — Progress Notes (Signed)
PFTs did not show any evidence of obstructive airways disease or restrictive lung disease. Please make sure patient has follow up in 4 weeks to discuss further

## 2021-02-01 ENCOUNTER — Other Ambulatory Visit: Payer: Self-pay | Admitting: Family Medicine

## 2021-02-01 DIAGNOSIS — J439 Emphysema, unspecified: Secondary | ICD-10-CM

## 2021-02-02 NOTE — Telephone Encounter (Signed)
Last filled on 11/19/20 with 2 refills. LOV 11/19/20 with Dr Einar Pheasant for acute visit-Pulmonary emphysema,

## 2021-02-02 NOTE — Telephone Encounter (Signed)
Will send 1 more to pharmacy. Recommend f/u in office or with pulm as scheduled before additional refills

## 2021-02-03 NOTE — Telephone Encounter (Signed)
Message relayed to pt via mychart

## 2021-02-06 NOTE — Assessment & Plan Note (Addendum)
-   Sleep study in 2017 showed severe obstructive sleep apnea with average AHI 51/hr (supine position AHI 128). Patient is 93% compliant with CPAP. He is having a large moderate to large amount airleaks. Pressure 13cm h20; residual AHI 0.8. Advised he change out CPAP mask for new one. Continue to use CPAP every night for 4-6 hours or more. He is not to drive if experiencing excessive daytime fatigue or somnolence

## 2021-02-06 NOTE — Assessment & Plan Note (Signed)
-   Patient reports dyspnea with exertion/shallow breathing  - Advised he use albuterol rescue inahler 2 puffs every 6 hours for shortness of breath/wheezing - Follow-up 4-8 weeks after PFTs

## 2021-02-06 NOTE — Assessment & Plan Note (Signed)
-   Low-dose CT chest on 11/04/2020 showed lung RADS 2, benign appearance of behavior of lung nodules.  Continue annual screening with low-dose CT chest.  Scattered tiny bilateral pulmonary nodules measuring up to maximum 3.5 mm.  Centrilobular emphysema noted.

## 2021-02-28 ENCOUNTER — Other Ambulatory Visit: Payer: Self-pay | Admitting: Family Medicine

## 2021-02-28 DIAGNOSIS — J439 Emphysema, unspecified: Secondary | ICD-10-CM

## 2021-03-01 ENCOUNTER — Other Ambulatory Visit: Payer: Self-pay

## 2021-03-01 ENCOUNTER — Encounter: Payer: Self-pay | Admitting: Primary Care

## 2021-03-01 ENCOUNTER — Ambulatory Visit: Payer: BC Managed Care – PPO | Admitting: Primary Care

## 2021-03-01 DIAGNOSIS — J439 Emphysema, unspecified: Secondary | ICD-10-CM | POA: Diagnosis not present

## 2021-03-01 MED ORDER — FLUTICASONE FUROATE-VILANTEROL 100-25 MCG/INH IN AEPB: 1.0000 | INHALATION_SPRAY | Freq: Every day | RESPIRATORY_TRACT | 0 refills | Status: DC

## 2021-03-01 NOTE — Progress Notes (Signed)
$'@Patient'z$  ID: [name], male    DOB: 1966-02-21, 55 y.o.   MRN: [id number]  No chief complaint on file.   Referring provider: No ref. provider found  HPI: 55 year old male, former smoker quit in 2016 (30-pack-year history).  Past medical history significant for emphysema, obstructive sleep apnea.  Seen for initial sleep consult by Wyn Quaker, NP on 09/05/2020.  Sleep study in 2017 showed severe obstructive sleep apnea with average AHI 51/hr (supine position AHI 128).  Referred to lung cancer screening program ordered for PFTs.  Previous [facility] pulmonary encounter: 12/28/2020 Patient presents today for 46-month follow-up. Patient has a history of severe obstructive sleep apnea. He is compliant with CPAP. Uses full face, its been > 6 months since he changed. He is having a lot of airleaks. Gets CPAP supplies from [place]. He reports shallow breathing and feels he runs out of air with activity. He mows his lawn wearing a mask. He uses albuterol every other day without significant improvement.     Airview download 11/28/20- 12/27/20 28/30 days (93%); 93% > 4 hours Average usage 5 hours 41 mins Pressure 13cm h20 Airleaks 77.5L/min AHI 0.8  03/01/2021- interim hx  Patient presents today for 2 month follow-up. He is continues to have some dyspnea symptoms, but overall they are some better. Associated nocturnal wheezing. He denies cough. He is aware that he needs to work on weight loss. PFTs did not showed evidence of obstructive or restrictive lung disease. LDCT showed centrilobular emphysema.    Imaging:  Low-dose CT chest on 11/04/2020 showed lung RADS 2, benign appearance of behavior of lung nodules.  Continue annual screening with low-dose CT chest.  Scattered tiny bilateral pulmonary nodules measuring up to maximum 3.5 mm.  Centrilobular emphysema noted.  Allergies  Allergen Reactions   Penicillins Other (See Comments)   Oxycodone     Immunization History  Administered Date(s) Administered    Hepatitis B, adult 02/05/2017, 03/13/2017, 08/07/2017   Influenza,inj,Quad PF,6+ Mos 09/05/2020    Past Medical History:  Diagnosis Date   Sleep apnea     Tobacco History: Social History   Tobacco Use  Smoking Status Former   Packs/day: 1.00   Years: 30.00   Pack years: 30.00   Types: Cigarettes   Quit date: 2016   Years since quitting: 6.5  Smokeless Tobacco Never   Counseling given: Not Answered   Outpatient Medications Prior to Visit  Medication Sig Dispense Refill   albuterol (VENTOLIN HFA) 108 (90 Base) MCG/ACT inhaler TAKE 2 PUFFS BY MOUTH EVERY 6 HOURS AS NEEDED FOR WHEEZE OR SHORTNESS OF BREATH 8.5 each 0   Celecoxib (CELEBREX PO) Take by mouth daily. (Patient not taking: No sig reported)     No facility-administered medications prior to visit.      Review of Systems  Review of Systems  Constitutional: Negative.   Respiratory:  Positive for wheezing. Negative for cough.     Physical Exam  BP 136/80 (BP Location: Left Arm, Patient Position: Sitting, Cuff Size: Normal)   Pulse 95   Temp 98.5 F (36.9 C) (Oral)   Ht $R'5\' 11"'GE$  (1.803 m)   Wt (!) 302 lb (137 kg)   SpO2 98%   BMI 42.12 kg/m  Physical Exam Constitutional:      General: He is not in acute distress.    Appearance: Normal appearance. He is obese. He is not ill-appearing.  Cardiovascular:     Rate and Rhythm: Normal rate and regular rhythm.  Pulmonary:  Effort: Pulmonary effort is normal.     Breath sounds: Normal breath sounds. No wheezing, rhonchi or rales.     Comments: CTA Neurological:     General: No focal deficit present.     Mental Status: He is alert and oriented to person, place, and time. Mental status is at baseline.  Psychiatric:        Mood and Affect: Mood normal.        Behavior: Behavior normal.        Thought Content: Thought content normal.        Judgment: Judgment normal.     Lab Results:  CBC    Component Value Date/Time   WBC 8.2 08/01/2020 1118    RBC 5.35 08/01/2020 1118   HGB 16.5 08/01/2020 1118   HCT 47.6 08/01/2020 1118   PLT 214.0 08/01/2020 1118   MCV 89.0 08/01/2020 1118   MCHC 34.5 08/01/2020 1118   RDW 14.0 08/01/2020 1118   LYMPHSABS 2.3 08/01/2020 1118   MONOABS 0.5 08/01/2020 1118   EOSABS 0.4 08/01/2020 1118   BASOSABS 0.1 08/01/2020 1118    BMET    Component Value Date/Time   NA 137 08/01/2020 1118   K 4.0 08/01/2020 1118   CL 99 08/01/2020 1118   CO2 30 08/01/2020 1118   GLUCOSE 127 (H) 08/01/2020 1118   BUN 10 08/01/2020 1118   CREATININE 1.05 08/01/2020 1118   CALCIUM 9.8 08/01/2020 1118   GFRNONAA >60 05/09/2007 0410   GFRAA  05/09/2007 0410    >60        The eGFR has been calculated using the MDRD equation. This calculation has not been validated in all clinical    BNP No results found for: BNP  ProBNP No results found for: PROBNP  Imaging: No results found.   Assessment & Plan:   Emphysema lung ([place]) - Dyspnea has improved but still persistent. Intermittent nocturnal wheeze. Denies cough - PFTs showed no overt evidence of obstructive or restrictive airway disease; CT imaging showed centrilobular emphysema  - Trial BREO 100 one puff daily in morning x 2 weeks, if patient notices benefit we will send in RX - Encourage weight loss effort and increasing physical exercise  - Follow-up 3-6 months with Dr. Mortimer Fries or Patsey Berthold (30 min slot) or sooner if needed     Martyn Ehrich, NP 03/07/2021

## 2021-03-01 NOTE — Patient Instructions (Addendum)
Pulmonary function testing was reassuring, no overt evidence of obstructive or restrictive lung disease. You have mild emphysema on CT imaging. Recommend trial inhaler for COPD called Breo, take one puff daily for 2 weeks. If you notice improvement in breathing please let our office know and we will send in prescription.   Follow-up: 3-6 months with Dr. Mortimer Fries or Patsey Berthold (30 min slot)

## 2021-03-07 NOTE — Assessment & Plan Note (Addendum)
-   Dyspnea has improved but still persistent. Intermittent nocturnal wheeze. Denies cough - PFTs showed no overt evidence of obstructive or restrictive airway disease; CT imaging showed centrilobular emphysema  - Trial BREO 100 one puff daily in morning x 2 weeks, if patient notices benefit we will send in RX - Encourage weight loss effort and increasing physical exercise  - Follow-up 3-6 months with Dr. Mortimer Fries or Patsey Berthold (30 min slot) or sooner if needed

## 2021-08-03 DIAGNOSIS — R051 Acute cough: Secondary | ICD-10-CM | POA: Diagnosis not present

## 2021-08-03 DIAGNOSIS — J101 Influenza due to other identified influenza virus with other respiratory manifestations: Secondary | ICD-10-CM | POA: Diagnosis not present

## 2021-12-15 ENCOUNTER — Ambulatory Visit: Payer: BC Managed Care – PPO | Admitting: Family

## 2021-12-15 ENCOUNTER — Encounter: Payer: Self-pay | Admitting: Family

## 2021-12-15 VITALS — BP 128/64 | HR 70 | Temp 98.1°F | Resp 16 | Ht 71.0 in | Wt 253.5 lb

## 2021-12-15 DIAGNOSIS — R300 Dysuria: Secondary | ICD-10-CM | POA: Insufficient documentation

## 2021-12-15 DIAGNOSIS — R339 Retention of urine, unspecified: Secondary | ICD-10-CM | POA: Insufficient documentation

## 2021-12-15 DIAGNOSIS — N390 Urinary tract infection, site not specified: Secondary | ICD-10-CM | POA: Diagnosis not present

## 2021-12-15 LAB — CBC WITH DIFFERENTIAL/PLATELET
Basophils Absolute: 0.1 10*3/uL (ref 0.0–0.1)
Basophils Relative: 1.1 % (ref 0.0–3.0)
Eosinophils Absolute: 0.2 10*3/uL (ref 0.0–0.7)
Eosinophils Relative: 2.7 % (ref 0.0–5.0)
HCT: 43.1 % (ref 39.0–52.0)
Hemoglobin: 14.7 g/dL (ref 13.0–17.0)
Lymphocytes Relative: 21.4 % (ref 12.0–46.0)
Lymphs Abs: 1.6 10*3/uL (ref 0.7–4.0)
MCHC: 34 g/dL (ref 30.0–36.0)
MCV: 90.1 fl (ref 78.0–100.0)
Monocytes Absolute: 0.4 10*3/uL (ref 0.1–1.0)
Monocytes Relative: 5.4 % (ref 3.0–12.0)
Neutro Abs: 5.3 10*3/uL (ref 1.4–7.7)
Neutrophils Relative %: 69.4 % (ref 43.0–77.0)
Platelets: 208 10*3/uL (ref 150.0–400.0)
RBC: 4.78 Mil/uL (ref 4.22–5.81)
RDW: 13.8 % (ref 11.5–15.5)
WBC: 7.7 10*3/uL (ref 4.0–10.5)

## 2021-12-15 LAB — BASIC METABOLIC PANEL
BUN: 12 mg/dL (ref 6–23)
CO2: 28 mEq/L (ref 19–32)
Calcium: 9.3 mg/dL (ref 8.4–10.5)
Chloride: 100 mEq/L (ref 96–112)
Creatinine, Ser: 0.94 mg/dL (ref 0.40–1.50)
GFR: 92.39 mL/min (ref >60.00)
Glucose, Bld: 89 mg/dL (ref 70–99)
Potassium: 4.1 mEq/L (ref 3.5–5.1)
Sodium: 137 mEq/L (ref 135–145)

## 2021-12-15 LAB — URINALYSIS, ROUTINE W REFLEX MICROSCOPIC
Bilirubin Urine: NEGATIVE
Hgb urine dipstick: NEGATIVE
Ketones, ur: NEGATIVE
Leukocytes,Ua: NEGATIVE
Nitrite: NEGATIVE
RBC / HPF: NONE SEEN (ref 0–?)
Specific Gravity, Urine: 1.025 (ref 1.000–1.030)
Urine Glucose: NEGATIVE
Urobilinogen, UA: 1 (ref 0.0–1.0)
pH: 6 (ref 5.0–8.0)

## 2021-12-15 LAB — POC URINALSYSI DIPSTICK (AUTOMATED)
Bilirubin, UA: NEGATIVE
Blood, UA: NEGATIVE
Glucose, UA: NEGATIVE
Ketones, UA: NEGATIVE
Nitrite, UA: NEGATIVE
Protein, UA: POSITIVE — AB
Spec Grav, UA: 1.025 (ref 1.010–1.025)
Urobilinogen, UA: 1 E.U./dL
pH, UA: 6 (ref 5.0–8.0)

## 2021-12-15 LAB — PSA: PSA: 8.78 ng/mL — ABNORMAL HIGH (ref 0.10–4.00)

## 2021-12-15 MED ORDER — CIPROFLOXACIN HCL 500 MG PO TABS: 500.0000 mg | ORAL_TABLET | Freq: Two times a day (BID) | ORAL | 0 refills | Status: AC

## 2021-12-15 NOTE — Progress Notes (Signed)
? ?Established Patient Office Visit ? ?Subjective:  ?Patient ID: [name], male    DOB: July 13, 1966  Age: 56 y.o. MRN: [id number] ? ?CC:  ?Chief Complaint  ?Patient presents with  ? Urinary Frequency  ?  X 1 weeks and started burning since Wednesday.  ? ? ?HPI ?[name] is here today with concerns.  ?With dysuria, urinary urgency and urinary frequency over the last 1.5 weeks.  ?Able to void just decreased amount. Has increased water intake , now taking 7 or so a day.  ? ?Married, no change of STD per pt.  ? ?Past Medical History:  ?Diagnosis Date  ? Sleep apnea   ? ? ?Past Surgical History:  ?Procedure Laterality Date  ? BILATERAL CARPAL TUNNEL RELEASE Bilateral 2021  ? COLONOSCOPY WITH PROPOFOL N/A 10/28/2020  ? TAs [name], MD)  ? JOINT REPLACEMENT Bilateral 2016  ? REVISION TOTAL KNEE ARTHROPLASTY Left 2020  ? [place] SURGERY  2007  ? Fusion L5  ? ? ?Family History  ?Problem Relation Age of Onset  ? Asthma Father   ? Diabetes Father   ? ? ?Social History  ? ?Socioeconomic History  ? Marital status: Married  ?  Spouse name: Not on file  ? Number of children: Not on file  ? Years of education: 26  ? Highest education level: High school graduate  ?Occupational History  ? Not on file  ?Tobacco Use  ? Smoking status: Former  ?  Packs/day: 1.00  ?  Years: 30.00  ?  Pack years: 30.00  ?  Types: Cigarettes  ?  Quit date: 2016  ?  Years since quitting: 7.2  ? Smokeless tobacco: Never  ?Vaping Use  ? Vaping Use: Never used  ?Substance and Sexual Activity  ? Alcohol use: Not Currently  ? Drug use: Never  ? Sexual activity: Yes  ?Other Topics Concern  ? Not on file  ?Social History Narrative  ? 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 Strong faith.   ? ?Social Determinants of Health  ? ?Financial Resource Strain: Not on file  ?Food Insecurity: Not on file  ?Transportation Needs: Not on file  ?Physical Activity: Not on  file  ?Stress: Not on file  ?Social Connections: Not on file  ?Intimate Partner Violence: Not on file  ? ? ?Outpatient Medications Prior to Visit  ?Medication Sig Dispense Refill  ? albuterol (VENTOLIN HFA) 108 (90 Base) MCG/ACT inhaler TAKE 2 PUFFS BY MOUTH EVERY 6 HOURS AS NEEDED FOR WHEEZE OR SHORTNESS OF BREATH (Patient not taking: Reported on 12/15/2021) 8.5 each 0  ? Celecoxib (CELEBREX PO) Take by mouth daily. (Patient not taking: No sig reported)    ? fluticasone furoate-vilanterol (BREO ELLIPTA) 100-25 MCG/INH AEPB Inhale 1 puff into the lungs daily. (Patient not taking: Reported on 12/15/2021) 14 each 0  ? ?No facility-administered medications prior to visit.  ? ? ?Allergies  ?Allergen Reactions  ? Penicillins Other (See Comments)  ? Oxycodone   ? ? ?ROS ?Review of Systems  ?Constitutional:  Negative for chills and fever.  ?HENT:  Negative for congestion, ear pain, sinus pain and sore throat.   ?Respiratory:  Negative for cough, shortness of breath and wheezing.   ?Cardiovascular:  Negative for chest pain and palpitations.  ?Genitourinary:  Positive for decreased urine volume, difficulty urinating, dysuria, frequency and urgency. Negative for flank pain, penile discharge, penile pain, scrotal  swelling and testicular pain.  ?Musculoskeletal:  Positive for back pain (some right mid flank pain).  ? ?  ?Objective:  ?  ?Physical Exam ?Constitutional:   ?   General: He is not in acute distress. ?   Appearance: He is obese. He is not ill-appearing, toxic-appearing or diaphoretic.  ?Pulmonary:  ?   Effort: Pulmonary effort is normal.  ?Abdominal:  ?   General: Abdomen is flat.  ?   Tenderness: There is no abdominal tenderness.  ?Skin: ?   General: Skin is warm.  ?Neurological:  ?   Mental Status: He is alert.  ? ? ?BP 128/64   Pulse 70   Temp 98.1 ?F (36.7 ?C)   Resp 16   Ht '5\' 11"'$  (1.803 m)   Wt 253 lb 8 oz (115 kg)   SpO2 96%   BMI 35.36 kg/m?  ?Wt Readings from Last 3 Encounters:  ?12/15/21 253 lb 8 oz  (115 kg)  ?03/01/21 (!) 302 lb (137 kg)  ?12/28/20 293 lb 9.6 oz (133.2 kg)  ? ? ? ?Health Maintenance Due  ?Topic Date Due  ? COVID-19 Vaccine (1) Never done  ? HIV Screening  Never done  ? Hepatitis C Screening  Never done  ? TETANUS/TDAP  Never done  ? Zoster Vaccines- Shingrix (1 of 2) Never done  ? ? ?There are no preventive care reminders to display for this patient. ? ?Lab Results  ?Component Value Date  ? TSH 1.93 08/01/2020  ? ?Lab Results  ?Component Value Date  ? WBC 8.2 08/01/2020  ? HGB 16.5 08/01/2020  ? HCT 47.6 08/01/2020  ? MCV 89.0 08/01/2020  ? PLT 214.0 08/01/2020  ? ?Lab Results  ?Component Value Date  ? NA 137 08/01/2020  ? K 4.0 08/01/2020  ? CO2 30 08/01/2020  ? GLUCOSE 127 (H) 08/01/2020  ? BUN 10 08/01/2020  ? CREATININE 1.05 08/01/2020  ? BILITOT 0.6 08/01/2020  ? ALKPHOS 48 08/01/2020  ? AST 27 08/01/2020  ? ALT 31 08/01/2020  ? PROT 7.7 08/01/2020  ? ALBUMIN 4.5 08/01/2020  ? CALCIUM 9.8 08/01/2020  ? GFR 81.68 08/01/2020  ? ?Lab Results  ?Component Value Date  ? HGBA1C 5.5 08/01/2020  ? ? ?  ?Assessment & Plan:  ? ?Problem List Items Addressed This Visit   ? ?  ? Genitourinary  ? Urinary retention  ?  Ordering psa ? ?  ?  ? Relevant Orders  ? PSA  ? CBC with Differential  ? Basic metabolic panel  ? Acute UTI (urinary tract infection)  ?  Reviewed poct urine dipstick ?Choosing to treat with symptoms, uti vs prostatitis ?Ordered psa, cbc bmp pending results ?Ordered microanalysis and urine culture, pending ?rx cipro 500 mg ?  ?  ?  ? Other  ? Dysuria - Primary  ?  poct urine today ?Reviewed, will choose to treat today  ?Ordered urine microanalysis and urine culture ?  ?  ? Relevant Medications  ? ciprofloxacin (CIPRO) 500 MG tablet  ? Other Relevant Orders  ? Urine Culture  ? Urinalysis, Routine w reflex microscopic  ? POCT Urinalysis Dipstick (Automated) (Completed)  ? CBC with Differential  ? ? ?Meds ordered this encounter  ?Medications  ? ciprofloxacin (CIPRO) 500 MG tablet  ?  Sig:  Take 1 tablet (500 mg total) by mouth 2 (two) times daily for 10 days.  ?  Dispense:  20 tablet  ?  Refill:  0  ?  Order Specific Question:  Supervising Provider  ?  Answer:   BEDSOLE, AMY E [[id number]]  ? ? ?Follow-up: Return in about 1 month (around 01/14/2022) for as TOC visit.  ? ? ?Eugenia Pancoast, FNP ?

## 2021-12-15 NOTE — Assessment & Plan Note (Signed)
poct urine today ?Reviewed, will choose to treat today  ?Ordered urine microanalysis and urine culture ?

## 2021-12-15 NOTE — Progress Notes (Signed)
Have patient schedule II week follow-up in the office and we will repeat his prostate levels. ?Good news is that his CBC was not elevated however his prostate antigen was elevated.  This could be because prostatitis which he is symptomatic for and the ciprofloxacin antibiotic will treat this.  We can repeat the prostate levels in the next 2 weeks and if still elevated send him to urology for further evaluation. ? ?I have also sent out the urine culture which is pending to make sure that the right antibiotic has been given.

## 2021-12-15 NOTE — Patient Instructions (Addendum)
It was a pleasure seeing you today.  ?Stop by the lab prior to leaving today. I will notify you of your results once received.  ? ?You were found to have a urinary tract infection, you have been prescribed an antibiotic to your preferred pharmacy. Please start antibiotic today as directed.  ? ?We are sending your urine for a culture to make sure you do not have a resistant bacteria. We will call you if we need to change your medications.  ? ?Please make sure you are drinking plenty of fluids over the next few days. ? ?If your symptoms do not improve over the next 5-7 days, or if they worsen, please let us know. Please also let us know if you have worsening back pain, fevers, chills, or body aches.  ? ?Regards,  ? ?Sora Olivo ? ?

## 2021-12-15 NOTE — Assessment & Plan Note (Signed)
Reviewed poct urine dipstick ?Choosing to treat with symptoms, uti vs prostatitis ?Ordered psa, cbc bmp pending results ?Ordered microanalysis and urine culture, pending ?rx cipro 500 mg ?

## 2021-12-15 NOTE — Assessment & Plan Note (Signed)
Ordering psa ? ?

## 2021-12-16 LAB — URINE CULTURE
MICRO NUMBER:: 13265585
SPECIMEN QUALITY:: ADEQUATE

## 2021-12-25 ENCOUNTER — Telehealth: Payer: Self-pay | Admitting: *Deleted

## 2021-12-25 NOTE — Telephone Encounter (Signed)
LMTC to schedule Yearly Lung CA CT Scan. 

## 2022-01-04 ENCOUNTER — Encounter: Payer: Self-pay | Admitting: Family

## 2022-01-04 ENCOUNTER — Ambulatory Visit: Payer: BC Managed Care – PPO | Admitting: Family

## 2022-01-04 VITALS — BP 122/74 | HR 75 | Temp 97.9°F | Ht 71.0 in | Wt 249.0 lb

## 2022-01-04 DIAGNOSIS — R972 Elevated prostate specific antigen [PSA]: Secondary | ICD-10-CM | POA: Diagnosis not present

## 2022-01-04 DIAGNOSIS — N410 Acute prostatitis: Secondary | ICD-10-CM | POA: Diagnosis not present

## 2022-01-04 MED ORDER — CIPROFLOXACIN HCL 500 MG PO TABS: 500.0000 mg | ORAL_TABLET | Freq: Two times a day (BID) | ORAL | 0 refills | Status: AC

## 2022-01-04 NOTE — Patient Instructions (Signed)
Continue on antibiotics.  ? ?A referral was placed today for urology. ?Please let us know if you have not heard back within 2 weeks about the referral. ? ?Stop by the lab prior to leaving today. I will notify you of your results once received.  ? ?Due to recent changes in healthcare laws, you may see results of your imaging and/or laboratory studies on MyChart before I have had a chance to review them.  I understand that in some cases there may be results that are confusing or concerning to you. Please understand that not all results are received at the same time and often I may need to interpret multiple results in order to provide you with the best plan of care or course of treatment. Therefore, I ask that you please give me 2 business days to thoroughly review all your results before contacting my office for clarification. Should we see a critical lab result, you will be contacted sooner.  ? ?It was a pleasure seeing you today! Please do not hesitate to reach out with any questions and or concerns. ? ?Regards,  ? ?Manpreet Strey ?FNP-C ? ? ?

## 2022-01-04 NOTE — Progress Notes (Signed)
? ?Established Patient Office Visit ? ?Subjective:  ?Patient ID: [name], male    DOB: [birth year]  Age: 56 y.o. MRN: [id number] ? ?CC:  ?Chief Complaint  ?Patient presents with  ? Follow-up  ?  Here for 1 mo f/u of abnormal labs.   ? ? ?HPI ?[name] is here today with concerns.  ? ?Last visit came in with urinary frequency and urgency as well as dysuria.  ?Frequency and dysuria improved but still with urgency.  ?Not hard to start a stream of urine. No urinary retention, feels like he is not getting complete emptying of bladder however. Urine culture was negative, urinalysis with trace protein. No blood.  ? ?Completed ciprofloxacin 500 mg po bid x 10 days.psa quite elevated last visit. Suspected prostatitis.  ?Lab Results  ?Component Value Date  ? PSA 2.09 01/04/2022  ? PSA 8.78 (H) 12/15/2021  ? PSA 1.25 08/01/2020  ? ?No fever or chills.  ?No flank pain.  ? ?Also, did start with back pain, has had chronic and comes and goes however about three weeks ago started to get worse. Lower back, sharp and stabbing. Currently seeing chiropractor but states that no xray has been completely.  ? ?Past Medical History:  ?Diagnosis Date  ? Sleep apnea   ? ? ?Past Surgical History:  ?Procedure Laterality Date  ? BILATERAL CARPAL TUNNEL RELEASE Bilateral 2021  ? COLONOSCOPY WITH PROPOFOL N/A 10/28/2020  ? TAs [name], MD)  ? JOINT REPLACEMENT Bilateral 2016  ? REVISION TOTAL KNEE ARTHROPLASTY Left 2020  ? [place] SURGERY  2007  ? Fusion L5  ? ? ?Family History  ?Problem Relation Age of Onset  ? Asthma Father   ? Diabetes Father   ? ? ?Social History  ? ?Socioeconomic History  ? Marital status: Married  ?  Spouse name: Not on file  ? Number of children: Not on file  ? Years of education: 87  ? Highest education level: High school graduate  ?Occupational History  ? Not on file  ?Tobacco Use  ? Smoking status: Former  ?  Packs/day: 1.00  ?  Years: 30.00  ?  Pack years: 30.00  ?  Types: Cigarettes  ?  Quit date: 2016  ?   Years since quitting: 7.3  ? Smokeless tobacco: Never  ?Vaping Use  ? Vaping Use: Never used  ?Substance and Sexual Activity  ? Alcohol use: Not Currently  ? Drug use: Never  ? Sexual activity: Yes  ?Other Topics Concern  ? Not on file  ?Social History Narrative  ? 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 Strong faith.   ? ?Social Determinants of Health  ? ?Financial Resource Strain: Not on file  ?Food Insecurity: Not on file  ?Transportation Needs: Not on file  ?Physical Activity: Not on file  ?Stress: Not on file  ?Social Connections: Not on file  ?Intimate Partner Violence: Not on file  ? ? ?No outpatient medications prior to visit.  ? ?No facility-administered medications prior to visit.  ? ? ?Allergies  ?Allergen Reactions  ? Penicillins Other (See Comments)  ? Oxycodone   ? ? ?ROS ?Review of Systems  ?Constitutional:  Negative for activity change, appetite change, chills, fatigue and fever.  ?Gastrointestinal:  Negative for abdominal pain.  ?Genitourinary:  Positive for urgency. Negative for decreased urine volume, difficulty urinating, dysuria, flank pain, frequency, genital sores and hematuria.  ?  Some urinary retention at times, feel like not having complete emptying of bladder  ?Musculoskeletal:  Positive for arthralgias (lower back pain at times as well as neck pain at times) and neck pain (with radiculopathy).  ?Neurological:  Negative for dizziness and light-headedness.  ?Psychiatric/Behavioral:  Negative for confusion.   ?All other systems reviewed and are negative. ? ?  ?Objective:  ?  ?Physical Exam ?Constitutional:   ?   General: He is awake. He is not in acute distress. ?   Appearance: Normal appearance. He is obese. He is not ill-appearing, toxic-appearing or diaphoretic.  ?HENT:  ?   Nose:  ?   Right Turbinates: Not enlarged or swollen.  ?   Left Turbinates: Not enlarged or swollen.  ?   Right  Sinus: No maxillary sinus tenderness or frontal sinus tenderness.  ?   Left Sinus: No maxillary sinus tenderness or frontal sinus tenderness.  ?   Mouth/Throat:  ?   Pharynx: No pharyngeal swelling, oropharyngeal exudate or posterior oropharyngeal erythema.  ?Neck:  ?   Comments: Positive spurlings test ?Cardiovascular:  ?   Rate and Rhythm: Normal rate and regular rhythm.  ?Pulmonary:  ?   Effort: Pulmonary effort is normal.  ?   Breath sounds: Normal breath sounds. No wheezing.  ?Abdominal:  ?   General: Abdomen is flat.  ?   Tenderness: There is no abdominal tenderness.  ?Musculoskeletal:  ?   Cervical back: Muscular tenderness (base of neck right side with tightness) present.  ?   Comments: No CVA tenderness bil on percussion or palpation ?  ?Neurological:  ?   General: No focal deficit present.  ?   Mental Status: He is alert and oriented to person, place, and time.  ?Psychiatric:     ?   Mood and Affect: Mood normal.     ?   Behavior: Behavior normal.     ?   Thought Content: Thought content normal.     ?   Judgment: Judgment normal.  ? ? ?BP 122/74   Pulse 75   Temp 97.9 ?F (36.6 ?C) (Temporal)   Ht '5\' 11"'$  (1.803 m)   Wt 249 lb (112.9 kg)   SpO2 95%   BMI 34.73 kg/m?  ?Wt Readings from Last 3 Encounters:  ?01/04/22 249 lb (112.9 kg)  ?12/15/21 253 lb 8 oz (115 kg)  ?03/01/21 (!) 302 lb (137 kg)  ? ? ? ?Health Maintenance Due  ?Topic Date Due  ? COVID-19 Vaccine (1) Never done  ? HIV Screening  Never done  ? Hepatitis C Screening  Never done  ? TETANUS/TDAP  Never done  ? Zoster Vaccines- Shingrix (1 of 2) Never done  ? ? ?There are no preventive care reminders to display for this patient. ? ?Lab Results  ?Component Value Date  ? TSH 1.93 08/01/2020  ? ?Lab Results  ?Component Value Date  ? WBC 7.7 12/15/2021  ? HGB 14.7 12/15/2021  ? HCT 43.1 12/15/2021  ? MCV 90.1 12/15/2021  ? PLT 208.0 12/15/2021  ? ?Lab Results  ?Component Value Date  ? NA 137 12/15/2021  ? K 4.1 12/15/2021  ? CO2 28 12/15/2021  ?  GLUCOSE 89 12/15/2021  ? BUN 12 12/15/2021  ? CREATININE 0.94 12/15/2021  ? BILITOT 0.6 08/01/2020  ? ALKPHOS 48 08/01/2020  ? AST 27 08/01/2020  ? ALT 31 08/01/2020  ? PROT 7.7 08/01/2020  ? ALBUMIN 4.5 08/01/2020  ? CALCIUM 9.3 12/15/2021  ? GFR 92.39 12/15/2021  ? ?  Lab Results  ?Component Value Date  ? HGBA1C 5.5 08/01/2020  ? ? ?  ?Assessment & Plan:  ? ?Problem List Items Addressed This Visit   ? ?  ? Genitourinary  ? Acute prostatitis - Primary  ?  Pending repeat psa.  ?rx for 20 more days of cipro 500 mg po bid as this has been helping but starting to have slight return of symptoms.  ?Advised pt see urology if sx do not resolve.  ? ? ?  ?  ? Relevant Medications  ? ciprofloxacin (CIPRO) 500 MG tablet  ? Other Relevant Orders  ? Ambulatory referral to Urology  ? PSA (Completed)  ?  ? Other  ? Elevated PSA  ?  Will repeat today ?Goal is to decrease as suspected prostatitis which we have been treating ?Referring to urology for now, if decreases and symptoms completely resolve we may not have to see urology ? ?  ?  ? Relevant Orders  ? Ambulatory referral to Urology  ? PSA (Completed)  ? ? ?Meds ordered this encounter  ?Medications  ? ciprofloxacin (CIPRO) 500 MG tablet  ?  Sig: Take 1 tablet (500 mg total) by mouth 2 (two) times daily for 20 days.  ?  Dispense:  40 tablet  ?  Refill:  0  ?  Order Specific Question:   Supervising Provider  ?  Answer:   BEDSOLE, AMY E [[id number]]  ? ? ?Follow-up: Return in about 3 months (around 04/06/2022) for for regular follow up .  ? ? ?Eugenia Pancoast, FNP ?

## 2022-01-05 DIAGNOSIS — R972 Elevated prostate specific antigen [PSA]: Secondary | ICD-10-CM | POA: Insufficient documentation

## 2022-01-05 DIAGNOSIS — N410 Acute prostatitis: Secondary | ICD-10-CM | POA: Insufficient documentation

## 2022-01-05 LAB — PSA: PSA: 2.09 ng/mL (ref 0.10–4.00)

## 2022-01-05 NOTE — Assessment & Plan Note (Signed)
Will repeat today ?Goal is to decrease as suspected prostatitis which we have been treating ?Referring to urology for now, if decreases and symptoms completely resolve we may not have to see urology ?

## 2022-01-05 NOTE — Assessment & Plan Note (Signed)
Pending repeat psa.  ?rx for 20 more days of cipro 500 mg po bid as this has been helping but starting to have slight return of symptoms.  ?Advised pt see urology if sx do not resolve.  ? ?

## 2022-01-09 ENCOUNTER — Encounter: Payer: Self-pay | Admitting: *Deleted

## 2022-05-14 IMAGING — CT CT CHEST LUNG CANCER SCREENING LOW DOSE W/O CM
2 of 5 series · 15 of 40 positions shown, 18 images · non-contrast
Comparison: None.

CLINICAL DATA: 54-year-old male with 30 pack-year history of
smoking. Lung cancer screening.

EXAM:
CT CHEST WITHOUT CONTRAST LOW-DOSE FOR LUNG CANCER SCREENING
TECHNIQUE: Multidetector CT imaging of the chest was performed following the
standard protocol without IV contrast.

[Series 3: lung 1.00 · axial · 0.81mm/px · z∈[+1645,+1946]mm · 12 of 333 slices shown, 15 images]
[im 16/333  mediastinal]
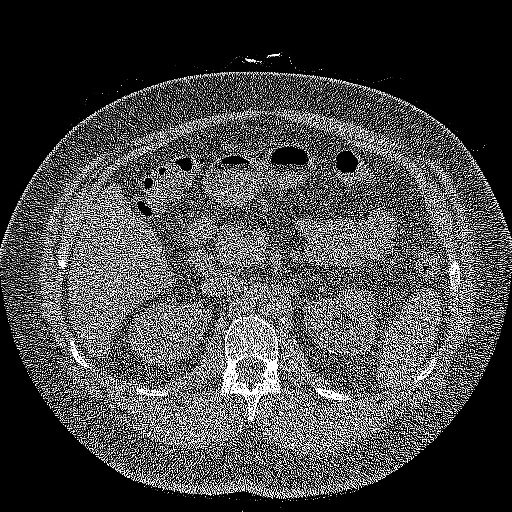
[im 16/333  lung]
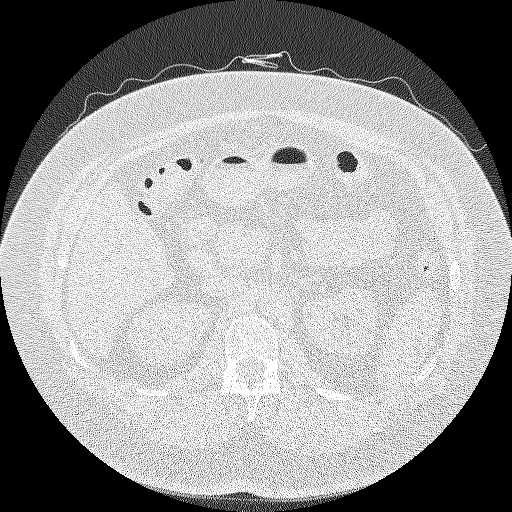
[im 46/333  lung]
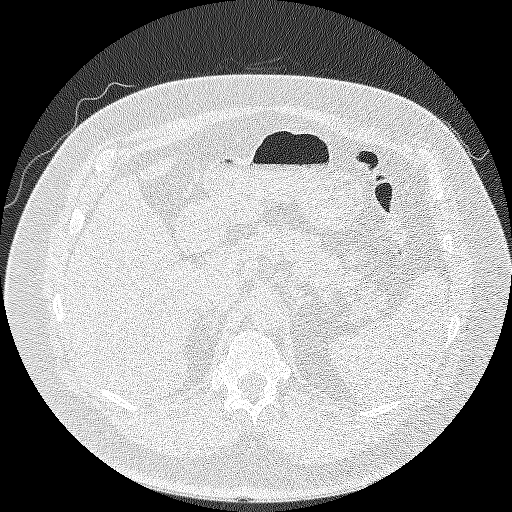
[im 76/333  lung]
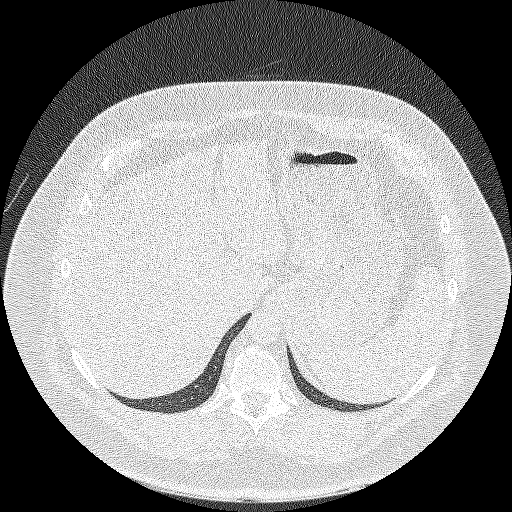
[im 106/333  lung]
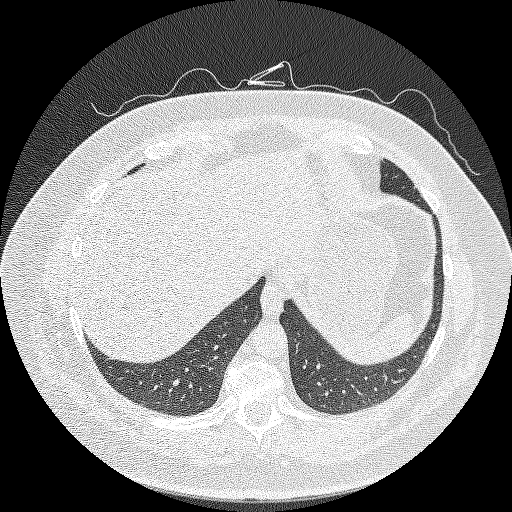
[im 121/333  mediastinal]
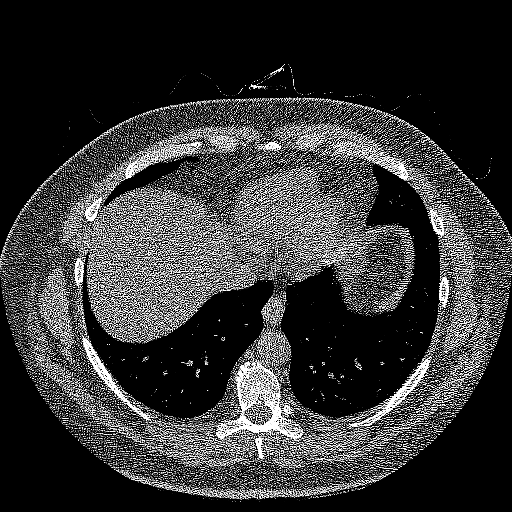
[im 121/333  lung]
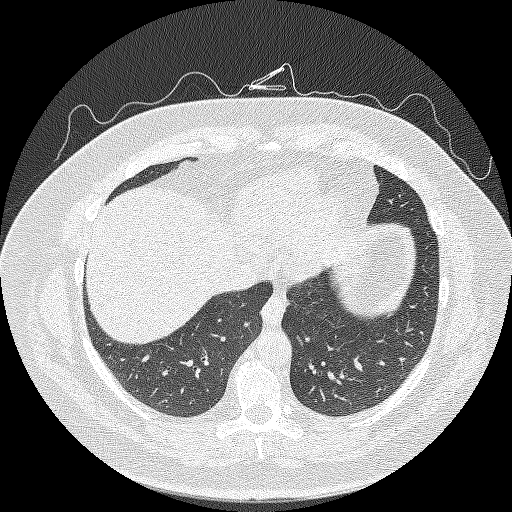
[im 151/333  lung]
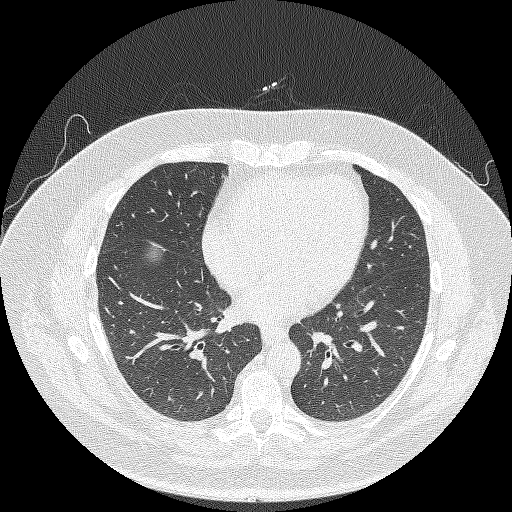
[im 182/333  lung]
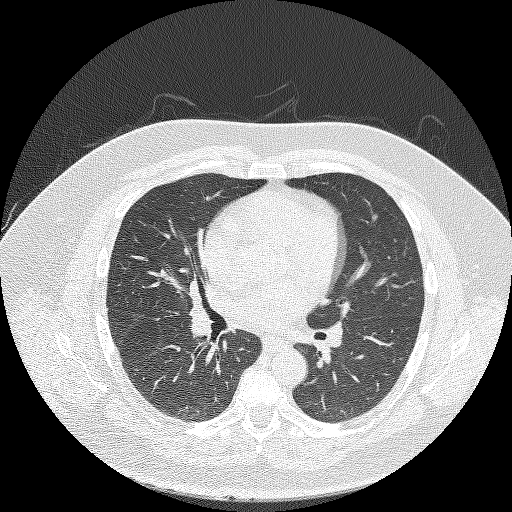
[im 212/333  lung]
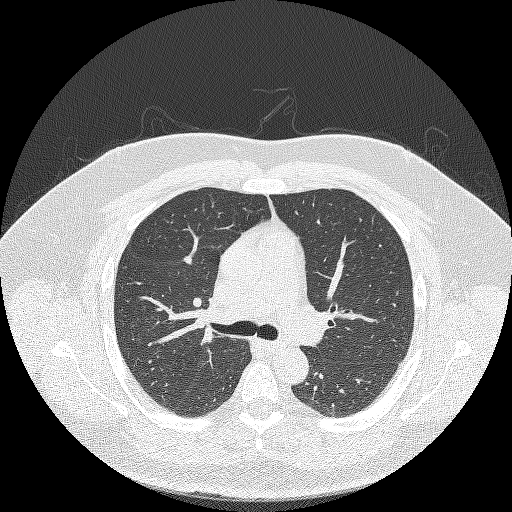
[im 227/333  mediastinal]
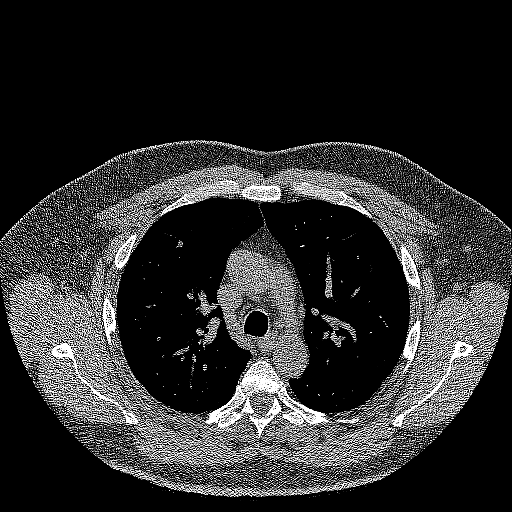
[im 227/333  lung]
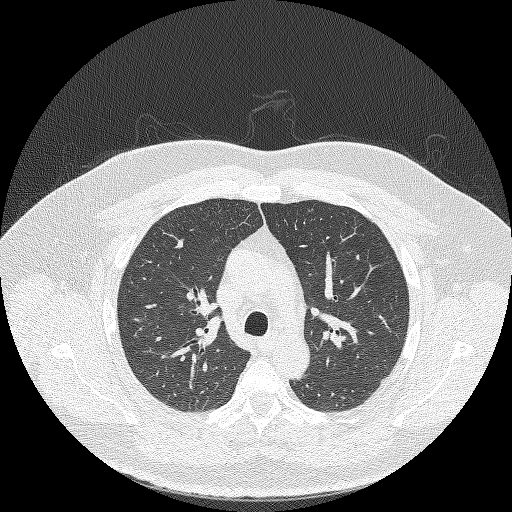
[im 257/333  lung]
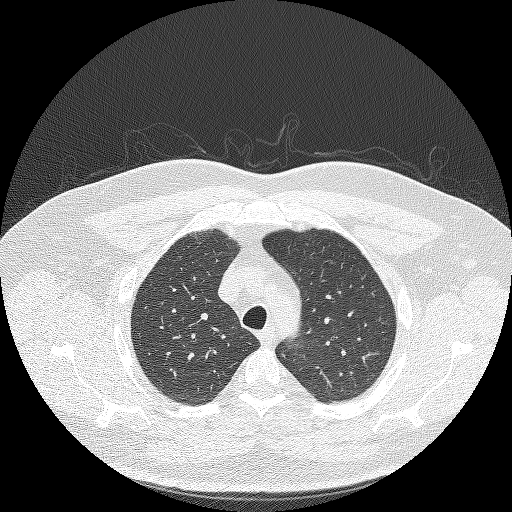
[im 287/333  lung]
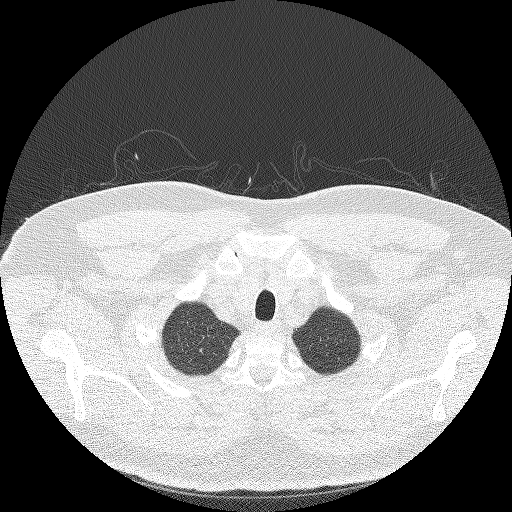
[im 317/333  lung]
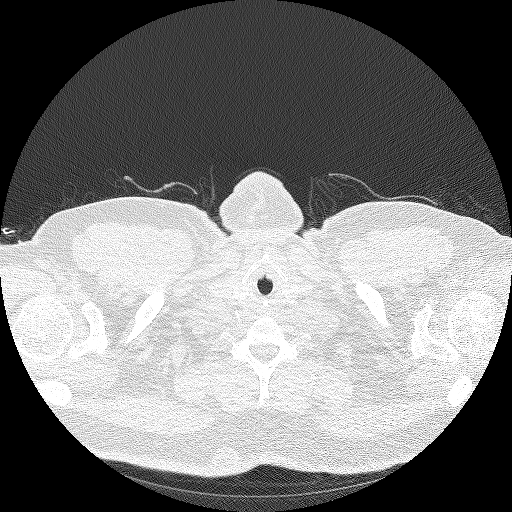

[Series 4: coronals lung 1.00 cor · coronal · 0.65mm/px · 3 of 413 slices shown]
[im 83/413  lung]
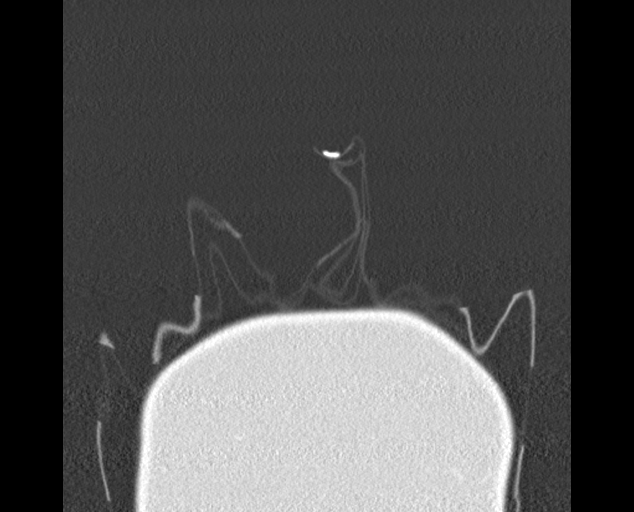
[im 165/413  lung]
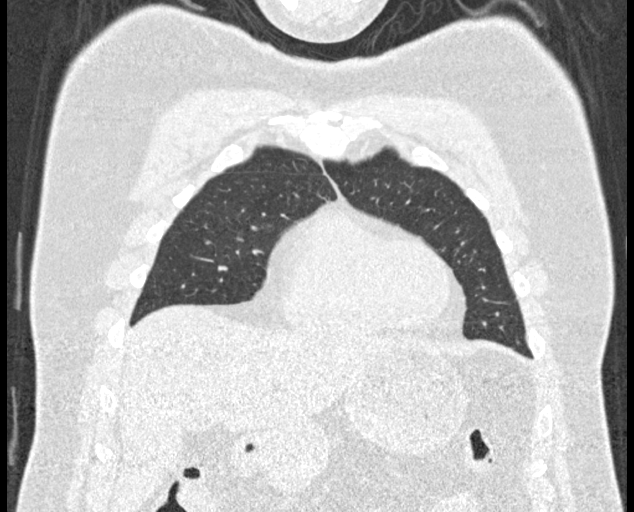
[im 248/413  lung]
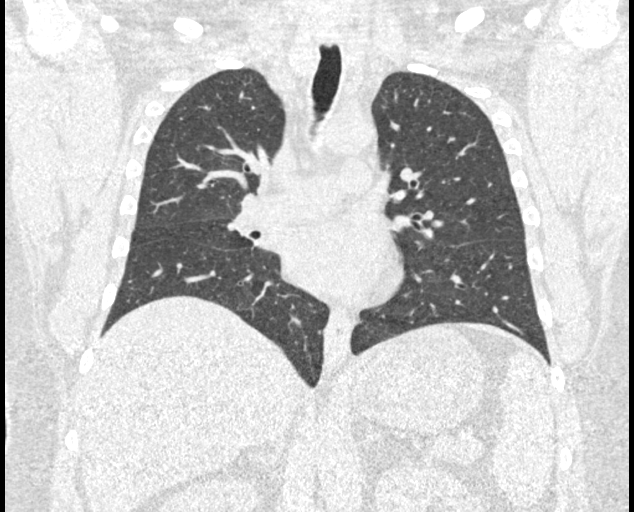

[15 of 40 positions shown; findings below may reference images not displayed]

FINDINGS: Cardiovascular: The heart size is normal. No substantial pericardial
effusion. No thoracic aortic aneurysm.

Mediastinum/Nodes: No mediastinal lymphadenopathy. No evidence for
gross hilar lymphadenopathy although assessment is limited by the
lack of intravenous contrast on today's study. The esophagus has
normal imaging features. There is no axillary lymphadenopathy.

Lungs/Pleura: Centrilobular emphsyema noted. Scattered tiny
bilateral pulmonary nodules are identified measuring up to maximum
volume derived equivalent diameter of 3.5 mm. No suspicious
pulmonary nodule or mass. No focal airspace consolidation. No
pleural effusion.

Upper Abdomen: The liver shows diffusely decreased attenuation
suggesting fat deposition. Calcified gallstone evident.

Musculoskeletal: No worrisome lytic or sclerotic osseous
abnormality.
IMPRESSION: 1. Lung-RADS 2, benign appearance or behavior. Continue annual
screening with low-dose chest CT without contrast in 12 months.
2. Cholelithiasis.
3. Hepatic steatosis.
4. Emphysema ([id number]).

## 2022-12-10 DIAGNOSIS — Z96651 Presence of right artificial knee joint: Secondary | ICD-10-CM | POA: Diagnosis not present

## 2022-12-10 DIAGNOSIS — T84498D Other mechanical complication of other internal orthopedic devices, implants and grafts, subsequent encounter: Secondary | ICD-10-CM | POA: Diagnosis not present

## 2022-12-20 DIAGNOSIS — Z96651 Presence of right artificial knee joint: Secondary | ICD-10-CM | POA: Diagnosis not present

## 2022-12-20 DIAGNOSIS — T84498D Other mechanical complication of other internal orthopedic devices, implants and grafts, subsequent encounter: Secondary | ICD-10-CM | POA: Diagnosis not present

## 2022-12-30 DIAGNOSIS — G959 Disease of spinal cord, unspecified: Secondary | ICD-10-CM | POA: Diagnosis not present

## 2023-01-09 DIAGNOSIS — T84032A Mechanical loosening of internal right knee prosthetic joint, initial encounter: Secondary | ICD-10-CM | POA: Diagnosis not present

## 2023-01-13 DIAGNOSIS — M5412 Radiculopathy, cervical region: Secondary | ICD-10-CM | POA: Diagnosis not present

## 2023-01-18 DIAGNOSIS — M5412 Radiculopathy, cervical region: Secondary | ICD-10-CM | POA: Diagnosis not present

## 2023-01-25 DIAGNOSIS — M5412 Radiculopathy, cervical region: Secondary | ICD-10-CM | POA: Diagnosis not present

## 2023-02-04 DIAGNOSIS — Z01812 Encounter for preprocedural laboratory examination: Secondary | ICD-10-CM | POA: Diagnosis not present

## 2023-02-04 DIAGNOSIS — T84032A Mechanical loosening of internal right knee prosthetic joint, initial encounter: Secondary | ICD-10-CM | POA: Diagnosis not present

## 2023-02-04 DIAGNOSIS — Z01818 Encounter for other preprocedural examination: Secondary | ICD-10-CM | POA: Diagnosis not present

## 2023-02-06 DIAGNOSIS — G4733 Obstructive sleep apnea (adult) (pediatric): Secondary | ICD-10-CM | POA: Diagnosis not present

## 2023-02-06 DIAGNOSIS — J449 Chronic obstructive pulmonary disease, unspecified: Secondary | ICD-10-CM | POA: Diagnosis not present

## 2023-02-07 DIAGNOSIS — T84032A Mechanical loosening of internal right knee prosthetic joint, initial encounter: Secondary | ICD-10-CM | POA: Diagnosis not present

## 2023-02-07 DIAGNOSIS — Z981 Arthrodesis status: Secondary | ICD-10-CM | POA: Diagnosis not present

## 2023-02-07 DIAGNOSIS — Z885 Allergy status to narcotic agent status: Secondary | ICD-10-CM | POA: Diagnosis not present

## 2023-02-07 DIAGNOSIS — G8918 Other acute postprocedural pain: Secondary | ICD-10-CM | POA: Diagnosis not present

## 2023-02-07 DIAGNOSIS — Z96651 Presence of right artificial knee joint: Secondary | ICD-10-CM | POA: Diagnosis not present

## 2023-02-07 DIAGNOSIS — G4733 Obstructive sleep apnea (adult) (pediatric): Secondary | ICD-10-CM | POA: Diagnosis not present

## 2023-02-07 DIAGNOSIS — Z9989 Dependence on other enabling machines and devices: Secondary | ICD-10-CM | POA: Diagnosis not present

## 2023-02-07 DIAGNOSIS — Z87891 Personal history of nicotine dependence: Secondary | ICD-10-CM | POA: Diagnosis not present

## 2023-02-07 DIAGNOSIS — Z880 Allergy status to penicillin: Secondary | ICD-10-CM | POA: Diagnosis not present

## 2023-02-07 DIAGNOSIS — J439 Emphysema, unspecified: Secondary | ICD-10-CM | POA: Diagnosis not present

## 2023-02-07 DIAGNOSIS — K7581 Nonalcoholic steatohepatitis (NASH): Secondary | ICD-10-CM | POA: Diagnosis not present

## 2023-02-07 DIAGNOSIS — J449 Chronic obstructive pulmonary disease, unspecified: Secondary | ICD-10-CM | POA: Diagnosis not present

## 2023-02-07 DIAGNOSIS — Z96652 Presence of left artificial knee joint: Secondary | ICD-10-CM | POA: Diagnosis not present

## 2023-02-07 DIAGNOSIS — M87051 Idiopathic aseptic necrosis of right femur: Secondary | ICD-10-CM | POA: Diagnosis not present

## 2023-02-07 DIAGNOSIS — H9190 Unspecified hearing loss, unspecified ear: Secondary | ICD-10-CM | POA: Diagnosis not present

## 2023-02-07 DIAGNOSIS — Y838 Other surgical procedures as the cause of abnormal reaction of the patient, or of later complication, without mention of misadventure at the time of the procedure: Secondary | ICD-10-CM | POA: Diagnosis not present

## 2023-02-07 DIAGNOSIS — Z79899 Other long term (current) drug therapy: Secondary | ICD-10-CM | POA: Diagnosis not present

## 2023-02-07 DIAGNOSIS — Z9889 Other specified postprocedural states: Secondary | ICD-10-CM | POA: Diagnosis not present

## 2023-02-11 DIAGNOSIS — M25561 Pain in right knee: Secondary | ICD-10-CM | POA: Diagnosis not present

## 2023-02-11 DIAGNOSIS — M25661 Stiffness of right knee, not elsewhere classified: Secondary | ICD-10-CM | POA: Diagnosis not present

## 2023-02-13 DIAGNOSIS — Z01812 Encounter for preprocedural laboratory examination: Secondary | ICD-10-CM | POA: Diagnosis not present

## 2023-02-14 DIAGNOSIS — M25561 Pain in right knee: Secondary | ICD-10-CM | POA: Diagnosis not present

## 2023-02-14 DIAGNOSIS — M25661 Stiffness of right knee, not elsewhere classified: Secondary | ICD-10-CM | POA: Diagnosis not present

## 2023-02-21 DIAGNOSIS — M25561 Pain in right knee: Secondary | ICD-10-CM | POA: Diagnosis not present

## 2023-02-21 DIAGNOSIS — M25661 Stiffness of right knee, not elsewhere classified: Secondary | ICD-10-CM | POA: Diagnosis not present

## 2023-02-22 DIAGNOSIS — M25561 Pain in right knee: Secondary | ICD-10-CM | POA: Diagnosis not present

## 2023-02-25 DIAGNOSIS — M25561 Pain in right knee: Secondary | ICD-10-CM | POA: Diagnosis not present

## 2023-02-25 DIAGNOSIS — M25661 Stiffness of right knee, not elsewhere classified: Secondary | ICD-10-CM | POA: Diagnosis not present

## 2023-02-28 DIAGNOSIS — M25561 Pain in right knee: Secondary | ICD-10-CM | POA: Diagnosis not present

## 2023-02-28 DIAGNOSIS — M25661 Stiffness of right knee, not elsewhere classified: Secondary | ICD-10-CM | POA: Diagnosis not present

## 2023-03-04 DIAGNOSIS — M25561 Pain in right knee: Secondary | ICD-10-CM | POA: Diagnosis not present

## 2023-03-04 DIAGNOSIS — M25661 Stiffness of right knee, not elsewhere classified: Secondary | ICD-10-CM | POA: Diagnosis not present

## 2023-03-11 DIAGNOSIS — M25661 Stiffness of right knee, not elsewhere classified: Secondary | ICD-10-CM | POA: Diagnosis not present

## 2023-03-11 DIAGNOSIS — M25561 Pain in right knee: Secondary | ICD-10-CM | POA: Diagnosis not present

## 2023-03-14 DIAGNOSIS — M25561 Pain in right knee: Secondary | ICD-10-CM | POA: Diagnosis not present

## 2023-03-14 DIAGNOSIS — M25661 Stiffness of right knee, not elsewhere classified: Secondary | ICD-10-CM | POA: Diagnosis not present

## 2023-03-18 DIAGNOSIS — M25661 Stiffness of right knee, not elsewhere classified: Secondary | ICD-10-CM | POA: Diagnosis not present

## 2023-03-18 DIAGNOSIS — M25561 Pain in right knee: Secondary | ICD-10-CM | POA: Diagnosis not present

## 2023-03-21 DIAGNOSIS — M25661 Stiffness of right knee, not elsewhere classified: Secondary | ICD-10-CM | POA: Diagnosis not present

## 2023-03-21 DIAGNOSIS — M25561 Pain in right knee: Secondary | ICD-10-CM | POA: Diagnosis not present

## 2023-03-22 ENCOUNTER — Ambulatory Visit (INDEPENDENT_AMBULATORY_CARE_PROVIDER_SITE_OTHER): Payer: BC Managed Care – PPO | Admitting: Family

## 2023-03-22 ENCOUNTER — Encounter: Payer: Self-pay | Admitting: Family

## 2023-03-22 VITALS — BP 120/82 | HR 84 | Temp 98.3°F | Ht 70.5 in | Wt 282.4 lb

## 2023-03-22 DIAGNOSIS — R972 Elevated prostate specific antigen [PSA]: Secondary | ICD-10-CM

## 2023-03-22 DIAGNOSIS — M5412 Radiculopathy, cervical region: Secondary | ICD-10-CM | POA: Diagnosis not present

## 2023-03-22 DIAGNOSIS — R413 Other amnesia: Secondary | ICD-10-CM

## 2023-03-22 DIAGNOSIS — Z13220 Encounter for screening for lipoid disorders: Secondary | ICD-10-CM

## 2023-03-22 DIAGNOSIS — R5383 Other fatigue: Secondary | ICD-10-CM | POA: Diagnosis not present

## 2023-03-22 DIAGNOSIS — Z818 Family history of other mental and behavioral disorders: Secondary | ICD-10-CM

## 2023-03-22 LAB — CBC
HCT: 44.3 % (ref 38.5–50.0)
MCH: 30.3 pg (ref 27.0–33.0)
MCHC: 33.9 g/dL (ref 32.0–36.0)
MCV: 89.5 fL (ref 80.0–100.0)
MPV: 11.7 fL (ref 7.5–12.5)
Platelets: 213 10*3/uL (ref 140–400)

## 2023-03-22 MED ORDER — PREGABALIN 75 MG PO CAPS
75.0000 mg | ORAL_CAPSULE | Freq: Two times a day (BID) | ORAL | 0 refills | Status: DC
Start: 2023-03-22 — End: 2023-05-07

## 2023-03-22 NOTE — Patient Instructions (Signed)
  Stop by the lab prior to leaving today. I will notify you of your results once received.    Regards,   Eugenia Pancoast FNP-C

## 2023-03-22 NOTE — Progress Notes (Unsigned)
Subjective:  Patient ID: [name], male    DOB: 05-Jul-1966  Age: 56 y.o. MRN: [id number]  Patient Care Team: Mort Sawyers, FNP as PCP - General (Family Medicine)   CC:  Chief Complaint  Patient presents with   Acute Visit    HPI [name] is a 57 y.o. male who presents today here with acute concerns.   Pt is with acute concerns.  H/o carpal tunnel syndrome surgery bil, was about three years ago however still states with nerve pain  He does have cervical radiculopathy into the hands. He was seeing Dr. Waymon Budge for cervical radiculopathy who started him on lyrica 75 mg once daily which he recently ran out of and states that it was helping.   Increasing memory loss, has noticed lately over the last few months. He does have a worry because his mom has dementia. He states at times forgets some things such as what kind of cancer his wife had, and or can't recollect things such as names of doctors he is seeing or medication he is taking. He does also have a high stress life right now, is back and forth caring for his mom as well as for his wife. 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  DEPRESSION SCREENING    03/22/2023   10:17 AM 08/01/2020   10:32 AM  PHQ 2/9 Scores  PHQ - 2 Score 4 0  PHQ- 9 Score 12      ROS: Negative unless specifically indicated above in HPI.    Current Outpatient Medications:    aspirin EC 81 MG tablet, Take 81 mg by mouth once., Disp: , Rfl:    celecoxib (CELEBREX) 200 MG capsule, Take 200 mg by mouth 2 (two) times daily., Disp: , Rfl:    pregabalin (LYRICA) 75 MG capsule, Take 1 capsule (75 mg total) by mouth 2 (two) times daily., Disp: 60 capsule, Rfl: 0   traMADol (ULTRAM) 50 MG tablet, Take 50 mg by mouth 2 (two) times daily., Disp: , Rfl:     Objective:    BP 120/82 (BP Location: Left Arm, Patient Position: Sitting, Cuff Size: Large)   Pulse 84   Temp 98.3 F (36.8 C) (Temporal)   Ht 5' 10.5" (1.791 m)   Wt 282 lb 6 oz  (128.1 kg)   SpO2 97%   BMI 39.94 kg/m   BP Readings from Last 3 Encounters:  03/22/23 120/82  01/04/22 122/74  12/15/21 128/64      Physical Exam Vitals reviewed.  Constitutional:      General: He is not in acute distress.    Appearance: Normal appearance. He is normal weight. He is not ill-appearing, toxic-appearing or diaphoretic.  Cardiovascular:     Rate and Rhythm: Normal rate.  Pulmonary:     Effort: Pulmonary effort is normal.  Musculoskeletal:        General: Normal range of motion.  Neurological:     General: No focal deficit present.     Mental Status: He is alert and oriented to person, place, and time. Mental status is at baseline.  Psychiatric:        Mood and Affect: Mood normal.        Behavior: Behavior normal.        Thought Content: Thought content normal.        Judgment: Judgment normal.          Assessment & Plan:  Elevated PSA  Cervical radiculopathy Assessment & Plan: Did advise pt to wear brace on hand at night might show some improving Pdmp improved, rx sent for refill lyrica 75 mg once daily. Did advise this may cause drowsiness.   Orders: -     Pregabalin; Take 1 capsule (75 mg total) by mouth 2 (two) times daily.  Dispense: 60 capsule; Refill: 0  Other fatigue -     Vitamin B12 -     CBC -     Basic metabolic panel  Screening for lipoid disorders -     Lipid panel  Memory loss Assessment & Plan: MOCA in office, reassuring.  Advised pt that stress can worsening memory at times due to thinking of a lot of things.  Did advise pt that if he does continue to have worsening memory recollection we will refer him to neurology.  Ordered b12 as well pending results.  Orders: -     Vitamin B12 -     CBC  Family history of dementia      Follow-up: Return in about 6 months (around 09/22/2023) for f/u CPE.   Mort Sawyers, FNP

## 2023-03-23 LAB — BASIC METABOLIC PANEL
BUN: 15 mg/dL (ref 7–25)
CO2: 28 mmol/L (ref 20–32)
Calcium: 9.8 mg/dL (ref 8.6–10.3)
Chloride: 99 mmol/L (ref 98–110)
Creat: 0.94 mg/dL (ref 0.70–1.30)
Glucose, Bld: 92 mg/dL (ref 65–99)
Potassium: 4.3 mmol/L (ref 3.5–5.3)
Sodium: 137 mmol/L (ref 135–146)

## 2023-03-23 LAB — VITAMIN B12: Vitamin B-12: 413 pg/mL (ref 200–1100)

## 2023-03-23 LAB — CBC
Hemoglobin: 15 g/dL (ref 13.2–17.1)
RBC: 4.95 10*6/uL (ref 4.20–5.80)
RDW: 13.1 % (ref 11.0–15.0)
WBC: 7.6 10*3/uL (ref 3.8–10.8)

## 2023-03-23 LAB — LIPID PANEL
Cholesterol: 134 mg/dL (ref <200)
HDL: 41 mg/dL (ref >=40)
LDL Cholesterol (Calc): 72 mg/dL (calc)
Non-HDL Cholesterol (Calc): 93 mg/dL (calc) (ref <130)
Total CHOL/HDL Ratio: 3.3 (calc) (ref <5.0)
Triglycerides: 126 mg/dL (ref <150)

## 2023-03-25 DIAGNOSIS — M5412 Radiculopathy, cervical region: Secondary | ICD-10-CM | POA: Insufficient documentation

## 2023-03-25 DIAGNOSIS — Z818 Family history of other mental and behavioral disorders: Secondary | ICD-10-CM | POA: Insufficient documentation

## 2023-03-25 DIAGNOSIS — M25661 Stiffness of right knee, not elsewhere classified: Secondary | ICD-10-CM | POA: Diagnosis not present

## 2023-03-25 DIAGNOSIS — M25561 Pain in right knee: Secondary | ICD-10-CM | POA: Diagnosis not present

## 2023-03-25 DIAGNOSIS — R413 Other amnesia: Secondary | ICD-10-CM | POA: Insufficient documentation

## 2023-03-25 NOTE — Assessment & Plan Note (Addendum)
MOCA in office, reassuring.  Advised pt that stress can worsening memory at times due to thinking of a lot of things.  Did advise pt that if he does continue to have worsening memory recollection we will refer him to neurology.  Ordered b12 as well pending results.

## 2023-03-25 NOTE — Assessment & Plan Note (Signed)
Did advise pt to wear brace on hand at night might show some improving Pdmp improved, rx sent for refill lyrica 75 mg once daily. Did advise this may cause drowsiness.

## 2023-03-28 DIAGNOSIS — M25661 Stiffness of right knee, not elsewhere classified: Secondary | ICD-10-CM | POA: Diagnosis not present

## 2023-03-28 DIAGNOSIS — M25561 Pain in right knee: Secondary | ICD-10-CM | POA: Diagnosis not present

## 2023-04-01 ENCOUNTER — Telehealth: Payer: Self-pay | Admitting: Primary Care

## 2023-04-01 ENCOUNTER — Encounter: Payer: Self-pay | Admitting: Primary Care

## 2023-04-01 ENCOUNTER — Ambulatory Visit: Payer: BC Managed Care – PPO | Admitting: Primary Care

## 2023-04-01 ENCOUNTER — Other Ambulatory Visit: Payer: Self-pay

## 2023-04-01 VITALS — BP 136/78 | HR 79 | Temp 98.2°F | Ht 71.0 in | Wt 284.6 lb

## 2023-04-01 DIAGNOSIS — R911 Solitary pulmonary nodule: Secondary | ICD-10-CM

## 2023-04-01 DIAGNOSIS — M25661 Stiffness of right knee, not elsewhere classified: Secondary | ICD-10-CM | POA: Diagnosis not present

## 2023-04-01 DIAGNOSIS — G4733 Obstructive sleep apnea (adult) (pediatric): Secondary | ICD-10-CM | POA: Diagnosis not present

## 2023-04-01 DIAGNOSIS — Z87891 Personal history of nicotine dependence: Secondary | ICD-10-CM

## 2023-04-01 DIAGNOSIS — Z122 Encounter for screening for malignant neoplasm of respiratory organs: Secondary | ICD-10-CM

## 2023-04-01 DIAGNOSIS — M25561 Pain in right knee: Secondary | ICD-10-CM | POA: Diagnosis not present

## 2023-04-01 DIAGNOSIS — G4700 Insomnia, unspecified: Secondary | ICD-10-CM | POA: Insufficient documentation

## 2023-04-01 MED ORDER — SPIRIVA RESPIMAT 2.5 MCG/ACT IN AERS: 2.0000 | INHALATION_SPRAY | Freq: Every day | RESPIRATORY_TRACT | Status: DC

## 2023-04-01 MED ORDER — SPIRIVA RESPIMAT 2.5 MCG/ACT IN AERS: 2.0000 | INHALATION_SPRAY | Freq: Every day | RESPIRATORY_TRACT | 11 refills | Status: DC

## 2023-04-01 MED ORDER — MELATONIN 5 MG PO TABS: ORAL_TABLET | ORAL | 3 refills | Status: DC

## 2023-04-01 NOTE — Telephone Encounter (Signed)
Scheduled for 04/09/23.  Thank you

## 2023-04-01 NOTE — Telephone Encounter (Signed)
Patient is overdue for LDCT, can we schedule

## 2023-04-01 NOTE — Telephone Encounter (Signed)
Patient would like to have CT within the next 4 weeks.

## 2023-04-01 NOTE — Progress Notes (Signed)
@Patient  ID: [name], male    DOB: 19-Apr-1966, 57 y.o.   MRN: [id number]  Chief Complaint  Patient presents with   Follow-up    Attempts to wear cpap nightly- did not wear cpap for 72mo due to recent knee replacement.     Referring provider: Mort Sawyers, FNP  HPI: 57 year old male, former smoker quit in 2016 (30-pack-year history).  Past medical history significant for emphysema, obstructive sleep apnea.  Seen for initial sleep consult by Elisha Headland, NP on 09/05/2020.  Sleep study in 2017 showed severe obstructive sleep apnea with average AHI 51/hr (supine position AHI 128).  Referred to lung cancer screening program ordered for PFTs.  Previous [facility] pulmonary encounter: 12/28/2020 Patient presents today for 9-month follow-up. Patient has a history of severe obstructive sleep apnea. He is compliant with CPAP. Uses full face, its been > 6 months since he changed. He is having a lot of airleaks. Gets CPAP supplies from [facility]. He reports shallow breathing and feels he runs out of air with activity. He mows his lawn wearing a mask. He uses albuterol every other day without significant improvement.     Airview download 11/28/20- 12/27/20 28/30 days (93%); 93% > 4 hours Average usage 5 hours 41 mins Pressure 13cm h20 Airleaks 77.5L/min AHI 0.8  03/01/2021 Patient presents today for 2 month follow-up. He is continues to have some dyspnea symptoms, but overall they are some better. Associated nocturnal wheezing. He denies cough. He is aware that he needs to work on weight loss. PFTs did not showed evidence of obstructive or restrictive lung disease. LDCT showed centrilobular emphysema.   04/01/2023- Interim hx  Patient presents today for an overdue follow-up.  He has not been seen by our office since June 2022. History of severe obstructive sleep apnea on CPAP and emphysema. He had knee surgery in June, CPAP compliance during this time was fair. He sleeps better in a recliner. He goes to bed  around 9pm and typically wakes up 4-5 hours later around 2am. A lot of times he will have trouble falling back to sleep. He starts his day at 3:30am. He drives trucks, he has his CDL license. He has to be at work by [employer]. Around 9am-11am he will feel tired but otherwise he's ok.  He continues to have some moderate dyspnea symptoms with exertion.  He is a former smoker. PFTs did not show evidence of obstructive or restrictive lung disease.  He has centrilobular emphysema on CT imaging.  He was following with lung cancer screening program, overdue for low dose CT.   Airview download 425/24-7/23/24 68/90 days (76%); 23 days (26%) greater than 4 hours Average usage days used 3 hours 5 minutes Pressure 12.4 cm H2O Air leak 17.8 L/min (95%) AHI 2.0  Imaging:  11/04/2020 LDCT>> showed lung RADS 2, benign appearance of behavior of lung nodules.  Continue annual screening with low-dose CT chest.  Scattered tiny bilateral pulmonary nodules measuring up to maximum 3.5 mm.  Centrilobular emphysema noted.  Allergies  Allergen Reactions   Penicillins Other (See Comments)   Oxycodone     Immunization History  Administered Date(s) Administered   Hepatitis B, ADULT 02/05/2017, 03/13/2017, 08/07/2017   Influenza,inj,Quad PF,6+ Mos 09/05/2020    Past Medical History:  Diagnosis Date   Sleep apnea     Tobacco History: Social History   Tobacco Use  Smoking Status Former   Current packs/day: 0.00   Average packs/day: 1 pack/day for 30.0 years (30.0 ttl pk-yrs)  Types: Cigarettes   Start date: 36   Quit date: 2016   Years since quitting: 8.5  Smokeless Tobacco Never   Counseling given: Not Answered   Outpatient Medications Prior to Visit  Medication Sig Dispense Refill   celecoxib (CELEBREX) 200 MG capsule Take 200 mg by mouth 2 (two) times daily.     pregabalin (LYRICA) 75 MG capsule Take 1 capsule (75 mg total) by mouth 2 (two) times daily. 60 capsule 0   traMADol (ULTRAM) 50 MG tablet  Take 50 mg by mouth 2 (two) times daily.     aspirin EC 81 MG tablet Take 81 mg by mouth once.     No facility-administered medications prior to visit.   Review of Systems  Review of Systems  Constitutional: Negative.  Negative for fatigue.  Respiratory:  Positive for shortness of breath. Negative for apnea, cough and wheezing.   Psychiatric/Behavioral:  Positive for sleep disturbance.    Physical Exam  BP 136/78 (BP Location: Left Arm, Cuff Size: Large)   Pulse 79   Temp 98.2 F (36.8 C) (Temporal)   Ht 5\' 11"  (1.803 m)   Wt 284 lb 9.6 oz (129.1 kg)   SpO2 98%   BMI 39.69 kg/m  Physical Exam Constitutional:      General: He is not in acute distress.    Appearance: Normal appearance. He is obese. He is not ill-appearing.  HENT:     Head: Normocephalic and atraumatic.     Mouth/Throat:     Mouth: Mucous membranes are moist.     Pharynx: Oropharynx is clear.  Cardiovascular:     Rate and Rhythm: Normal rate and regular rhythm.  Pulmonary:     Effort: Pulmonary effort is normal.     Breath sounds: Normal breath sounds. No wheezing, rhonchi or rales.  Skin:    General: Skin is warm and dry.  Neurological:     General: No focal deficit present.     Mental Status: He is alert and oriented to person, place, and time. Mental status is at baseline.  Psychiatric:        Mood and Affect: Mood normal.        Behavior: Behavior normal.        Thought Content: Thought content normal.        Judgment: Judgment normal.      Lab Results:  CBC    Component Value Date/Time   WBC 7.6 03/22/2023 1100   RBC 4.95 03/22/2023 1100   HGB 15.0 03/22/2023 1100   HCT 44.3 03/22/2023 1100   PLT 213 03/22/2023 1100   MCV 89.5 03/22/2023 1100   MCH 30.3 03/22/2023 1100   MCHC 33.9 03/22/2023 1100   RDW 13.1 03/22/2023 1100   LYMPHSABS 1.6 12/15/2021 0806   MONOABS 0.4 12/15/2021 0806   EOSABS 0.2 12/15/2021 0806   BASOSABS 0.1 12/15/2021 0806    BMET    Component Value  Date/Time   NA 137 03/22/2023 1100   K 4.3 03/22/2023 1100   CL 99 03/22/2023 1100   CO2 28 03/22/2023 1100   GLUCOSE 92 03/22/2023 1100   BUN 15 03/22/2023 1100   CREATININE 0.94 03/22/2023 1100   CALCIUM 9.8 03/22/2023 1100   GFRNONAA >60 05/09/2007 0410   GFRAA  05/09/2007 0410    >60        The eGFR has been calculated using the MDRD equation. This calculation has not been validated in all clinical    BNP No results  found for: "BNP"  ProBNP No results found for: "PROBNP"  Imaging: No results found.   Assessment & Plan:   Obstructive sleep apnea syndrome - Fair compliance with CPAP last 90 days due to recent knee surgery - Current pressure 12.4cm h20; Residual AHI 2.0/hour - Eligible for new CPAP machine, current one is >7 years - Encourage patient aim to wear CPAP nightly for 4 to 6 hours or longer - FU in 3 months   Emphysema lung (HCC) - Former smoker.  Experiences dyspnea symptoms with exertion.  He has centrilobular emphysema on CT imaging.  Pulmonary function testing showed no evidence of restrictive or obstructive lung disease.  Recommend trial Spiriva 2.5 mcg 2 puffs daily.   Lung nodule - Patient has scattered tiny bilateral pulmonary nodules measuring up to maximum 3.39mm. Following with lung cancer screening program, overdue for low-dose CT chest.  We reached out to nurse coordinator to get this scheduled.  Insomnia - Patient has difficulty maintaining sleep. Recommend trial melatonin 2.5-5mg . He was noted to have severe PLM during his sleep study in 2017, this may be causing sleep disruption and may want to consider medication like Requip in the future if sleep does not improve   Glenford Bayley, NP 04/01/2023

## 2023-04-01 NOTE — Assessment & Plan Note (Addendum)
-   Patient has difficulty maintaining sleep. Recommend trial melatonin 2.5-5mg . He was noted to have severe PLM during his sleep study in 2017, this may be causing sleep disruption and may want to consider medication like Requip in the future if sleep does not improve

## 2023-04-01 NOTE — Assessment & Plan Note (Addendum)
-   Fair compliance with CPAP last 90 days due to recent knee surgery - Current pressure 12.4cm h20; Residual AHI 2.0/hour - Eligible for new CPAP machine, current one is >7 years - Encourage patient aim to wear CPAP nightly for 4 to 6 hours or longer - FU in 3 months

## 2023-04-01 NOTE — Assessment & Plan Note (Signed)
-   Patient has scattered tiny bilateral pulmonary nodules measuring up to maximum 3.2mm. Following with lung cancer screening program, overdue for low-dose CT chest.  We reached out to nurse coordinator to get this scheduled.

## 2023-04-01 NOTE — Assessment & Plan Note (Signed)
-   Former smoker.  Experiences dyspnea symptoms with exertion.  He has centrilobular emphysema on CT imaging.  Pulmonary function testing showed no evidence of restrictive or obstructive lung disease.  Recommend trial Spiriva 2.5 mcg 2 puffs daily.

## 2023-04-01 NOTE — Patient Instructions (Addendum)
Recommendations: - Try melatonin 5mg  at bedtime to help with sleep (take 1/2 - 1 tablet at bedtime) - Continue to wear CPAP nightly, aim to use 4-6 hours a night  - Start Spiriva respimat- take 2 puffs daily in the morning x 2 week (if helps your breathing fill RX and let me know by mychart how your doing)   Orders: - Due for annual low dose CT Chest for lung cancer screening- they will contact you to schedule   Follow-up - 3 months with Beth NP (can be virtual- CPAP compliance)

## 2023-04-02 NOTE — Progress Notes (Signed)
Reviewed and agree with assessment/plan.   Coralyn Helling, MD [facility] Pulmonary/Critical [facility] 04/02/2023, 10:53 AM Pager:  [phone]

## 2023-04-03 DIAGNOSIS — M25561 Pain in right knee: Secondary | ICD-10-CM | POA: Diagnosis not present

## 2023-04-03 DIAGNOSIS — M25661 Stiffness of right knee, not elsewhere classified: Secondary | ICD-10-CM | POA: Diagnosis not present

## 2023-04-05 DIAGNOSIS — G4733 Obstructive sleep apnea (adult) (pediatric): Secondary | ICD-10-CM | POA: Diagnosis not present

## 2023-04-08 DIAGNOSIS — M25661 Stiffness of right knee, not elsewhere classified: Secondary | ICD-10-CM | POA: Diagnosis not present

## 2023-04-08 DIAGNOSIS — M25561 Pain in right knee: Secondary | ICD-10-CM | POA: Diagnosis not present

## 2023-04-09 ENCOUNTER — Ambulatory Visit
Admission: RE | Admit: 2023-04-09 | Discharge: 2023-04-09 | Disposition: A | Discharge status: Home / Self Care | Payer: BC Managed Care – PPO | Source: Ambulatory Visit | Attending: Family | Admitting: Family

## 2023-04-09 DIAGNOSIS — Z122 Encounter for screening for malignant neoplasm of respiratory organs: Secondary | ICD-10-CM | POA: Diagnosis not present

## 2023-04-09 DIAGNOSIS — Z87891 Personal history of nicotine dependence: Secondary | ICD-10-CM | POA: Insufficient documentation

## 2023-04-10 DIAGNOSIS — M25661 Stiffness of right knee, not elsewhere classified: Secondary | ICD-10-CM | POA: Diagnosis not present

## 2023-04-10 DIAGNOSIS — M25561 Pain in right knee: Secondary | ICD-10-CM | POA: Diagnosis not present

## 2023-04-15 ENCOUNTER — Other Ambulatory Visit: Payer: Self-pay

## 2023-04-15 DIAGNOSIS — M25561 Pain in right knee: Secondary | ICD-10-CM | POA: Diagnosis not present

## 2023-04-15 DIAGNOSIS — Z87891 Personal history of nicotine dependence: Secondary | ICD-10-CM

## 2023-04-15 DIAGNOSIS — Z122 Encounter for screening for malignant neoplasm of respiratory organs: Secondary | ICD-10-CM

## 2023-04-15 DIAGNOSIS — M25661 Stiffness of right knee, not elsewhere classified: Secondary | ICD-10-CM | POA: Diagnosis not present

## 2023-04-16 ENCOUNTER — Ambulatory Visit (INDEPENDENT_AMBULATORY_CARE_PROVIDER_SITE_OTHER): Payer: BC Managed Care – PPO | Admitting: Family

## 2023-04-16 VITALS — BP 124/86 | HR 80 | Temp 97.6°F | Ht 71.0 in | Wt 285.2 lb

## 2023-04-16 DIAGNOSIS — R911 Solitary pulmonary nodule: Secondary | ICD-10-CM

## 2023-04-16 DIAGNOSIS — R739 Hyperglycemia, unspecified: Secondary | ICD-10-CM | POA: Diagnosis not present

## 2023-04-16 DIAGNOSIS — K8020 Calculus of gallbladder without cholecystitis without obstruction: Secondary | ICD-10-CM

## 2023-04-16 DIAGNOSIS — F419 Anxiety disorder, unspecified: Secondary | ICD-10-CM

## 2023-04-16 DIAGNOSIS — R0609 Other forms of dyspnea: Secondary | ICD-10-CM

## 2023-04-16 DIAGNOSIS — L723 Sebaceous cyst: Secondary | ICD-10-CM | POA: Diagnosis not present

## 2023-04-16 DIAGNOSIS — R0602 Shortness of breath: Secondary | ICD-10-CM

## 2023-04-16 LAB — POCT GLYCOSYLATED HEMOGLOBIN (HGB A1C): Hemoglobin A1C: 4.9 % (ref 4.0–5.6)

## 2023-04-16 MED ORDER — BUSPIRONE HCL 5 MG PO TABS: 5.0000 mg | ORAL_TABLET | Freq: Two times a day (BID) | ORAL | 2 refills | Status: DC

## 2023-04-16 NOTE — Progress Notes (Signed)
Established Patient Office Visit  Subjective:      CC:  Chief Complaint  Patient presents with   Results   Shortness of Breath    Pt reports dyspnea with ambulation.     HPI: [name] is a 57 y.o. male presenting on 04/16/2023 for Results and Shortness of Breath (Pt reports dyspnea with ambulation. ) .   New complaints: Dyspnea with ambulation, he does see pulmonologist and even at times sitting still he will have some DOE. He was placed on trial spiriva, he states it has helped slightly however not completely. No pleural effusion seen on recent CT scan.   Anxiety depression, overwhelmed, mild relief with placing his mom in a home as she had worsening depression. He has stressors at home as well caring for his wife as well. Doesn't find he can sleep very well at night. He is open to trying some medication.      Social history:  Relevant past medical, surgical, family and social history reviewed and updated as indicated. Interim medical history since our last visit reviewed.  Allergies and medications reviewed and updated.  DATA REVIEWED: CHART IN EPIC     ROS: Negative unless specifically indicated above in HPI.    Current Outpatient Medications:    busPIRone (BUSPAR) 5 MG tablet, Take 1 tablet (5 mg total) by mouth 2 (two) times daily., Disp: 60 tablet, Rfl: 2   celecoxib (CELEBREX) 200 MG capsule, Take 200 mg by mouth 2 (two) times daily., Disp: , Rfl:    melatonin 5 MG TABS, Take 1/2-1 tablet at bedtime for insomnia, Disp: 30 tablet, Rfl: 3   pregabalin (LYRICA) 75 MG capsule, Take 1 capsule (75 mg total) by mouth 2 (two) times daily., Disp: 60 capsule, Rfl: 0   Tiotropium Bromide Monohydrate (SPIRIVA RESPIMAT) 2.5 MCG/ACT AERS, Inhale 2 puffs into the lungs daily., Disp: , Rfl:    Tiotropium Bromide Monohydrate (SPIRIVA RESPIMAT) 2.5 MCG/ACT AERS, Inhale 2 puffs into the lungs daily., Disp: 4 g, Rfl: 11      Objective:    BP 124/86   Pulse 80   Temp  97.6 F (36.4 C) (Oral)   Ht 5\' 11"  (1.803 m)   Wt 285 lb 3.2 oz (129.4 kg)   SpO2 97%   BMI 39.78 kg/m   Wt Readings from Last 3 Encounters:  04/16/23 285 lb 3.2 oz (129.4 kg)  04/09/23 284 lb (128.8 kg)  04/01/23 284 lb 9.6 oz (129.1 kg)    Physical Exam Constitutional:      General: He is not in acute distress.    Appearance: Normal appearance. He is obese. He is not ill-appearing, toxic-appearing or diaphoretic.  Cardiovascular:     Rate and Rhythm: Normal rate.  Pulmonary:     Effort: Pulmonary effort is normal.  Musculoskeletal:        General: Normal range of motion.  Neurological:     General: No focal deficit present.     Mental Status: He is alert and oriented to person, place, and time. Mental status is at baseline.  Psychiatric:        Mood and Affect: Mood normal.        Behavior: Behavior normal.        Thought Content: Thought content normal.        Judgment: Judgment normal.           Assessment & Plan:  Sebaceous cyst  Calculus of gallbladder without cholecystitis without obstruction  Hyperglycemia -     POCT glycosylated hemoglobin (Hb A1C)  DOE (dyspnea on exertion) -     ECHOCARDIOGRAM COMPLETE; Future  Anxiety Assessment & Plan: Trial start buspirone 5 mg twice daily.    Orders: -     busPIRone HCl; Take 1 tablet (5 mg total) by mouth 2 (two) times daily.  Dispense: 60 tablet; Refill: 2  Lung nodule     Return in about 6 weeks (around 05/28/2023) for f/u anxiety.  Mort Sawyers, MSN, APRN, FNP-C [facility]

## 2023-04-16 NOTE — Assessment & Plan Note (Addendum)
eCOPD vs heart  Ordering echocardiogram for thoroughness.  Low suspicion for CHF due to recent CT chest without pleural effusion  Advised pt to f/u with pulmonary as well, continue spiriva.

## 2023-04-16 NOTE — Assessment & Plan Note (Signed)
Trial start buspirone 5 mg twice daily.

## 2023-04-16 NOTE — Assessment & Plan Note (Signed)
<   8 mm  Repeat ct chest one year

## 2023-04-16 NOTE — Progress Notes (Signed)
Established Patient Office Visit  Subjective:      CC:  Chief Complaint  Patient presents with   Results   Shortness of Breath    Pt reports dyspnea with ambulation.     HPI: [name] is a 57 y.o. male presenting on 04/16/2023 for Results and Shortness of Breath (Pt reports dyspnea with ambulation. ) .  C/o Dyspnea with ambulation, he does see pulmonologist and even at times sitting still he will have some DOE. He was placed on trial spiriva last visit with her, he states it has helped slightly however not completely. No pleural effusion seen on recent CT scan. No real edema, does state slight on right lower extremity but s/p right knee surgery.   Anxiety depression, overwhelmed, mild relief with placing his mom in a home as she had worsening depression. He has stressors at home as well caring for his wife as well. Doesn't find he can sleep very well at night. He is open to trying some medication.      Social history:  Relevant past medical, surgical, family and social history reviewed and updated as indicated. Interim medical history since our last visit reviewed.  Allergies and medications reviewed and updated.  DATA REVIEWED: CHART IN EPIC     ROS: Negative unless specifically indicated above in HPI.    Current Outpatient Medications:    busPIRone (BUSPAR) 5 MG tablet, Take 1 tablet (5 mg total) by mouth 2 (two) times daily., Disp: 60 tablet, Rfl: 2   celecoxib (CELEBREX) 200 MG capsule, Take 200 mg by mouth 2 (two) times daily., Disp: , Rfl:    melatonin 5 MG TABS, Take 1/2-1 tablet at bedtime for insomnia, Disp: 30 tablet, Rfl: 3   pregabalin (LYRICA) 75 MG capsule, Take 1 capsule (75 mg total) by mouth 2 (two) times daily., Disp: 60 capsule, Rfl: 0   Tiotropium Bromide Monohydrate (SPIRIVA RESPIMAT) 2.5 MCG/ACT AERS, Inhale 2 puffs into the lungs daily., Disp: , Rfl:    Tiotropium Bromide Monohydrate (SPIRIVA RESPIMAT) 2.5 MCG/ACT AERS, Inhale 2 puffs into  the lungs daily., Disp: 4 g, Rfl: 11      Objective:    BP 124/86   Pulse 80   Temp 97.6 F (36.4 C) (Oral)   Ht 5\' 11"  (1.803 m)   Wt 285 lb 3.2 oz (129.4 kg)   SpO2 97%   BMI 39.78 kg/m   Wt Readings from Last 3 Encounters:  04/16/23 285 lb 3.2 oz (129.4 kg)  04/09/23 284 lb (128.8 kg)  04/01/23 284 lb 9.6 oz (129.1 kg)    Physical Exam Constitutional:      General: He is not in acute distress.    Appearance: Normal appearance. He is normal weight. He is not ill-appearing, toxic-appearing or diaphoretic.  Cardiovascular:     Rate and Rhythm: Normal rate and regular rhythm.  Pulmonary:     Effort: Pulmonary effort is normal.  Musculoskeletal:        General: Normal range of motion.     Right lower leg: 1+ Edema present.  Neurological:     General: No focal deficit present.     Mental Status: He is alert and oriented to person, place, and time. Mental status is at baseline.  Psychiatric:        Mood and Affect: Mood normal.        Behavior: Behavior normal.        Thought Content: Thought content normal.  Judgment: Judgment normal.           Assessment & Plan:  Sebaceous cyst  Calculus of gallbladder without cholecystitis without obstruction Assessment & Plan: Advised to follow a gallbladder friendly diet.   Hyperglycemia Assessment & Plan: Poc A1c in office today. Pt advised of the following: Work on a diabetic diet, try to incorporate exercise at least 20-30 a day for 3 days a week or more.    Orders: -     POCT glycosylated hemoglobin (Hb A1C)  DOE (dyspnea on exertion) -     ECHOCARDIOGRAM COMPLETE; Future  Anxiety Assessment & Plan: Trial start buspirone 5 mg twice daily.    Orders: -     busPIRone HCl; Take 1 tablet (5 mg total) by mouth 2 (two) times daily.  Dispense: 60 tablet; Refill: 2  Lung nodule Assessment & Plan: < 8 mm  Repeat ct chest one year   Dyspnea on exertion Assessment & Plan:  eCOPD vs heart  Ordering  echocardiogram for thoroughness.  Low suspicion for CHF due to recent CT chest without pleural effusion  Advised pt to f/u with pulmonary as well, continue spiriva.      Return in about 6 weeks (around 05/28/2023) for f/u anxiety.  Mort Sawyers, MSN, APRN, FNP-C [facility]

## 2023-04-16 NOTE — Patient Instructions (Addendum)
  I have placed an order for an echocardiogram which is an ultrasound of your heart. This will be to evaluate further your shortness of breath and make sure it is not anything in addition to COPD.  I also would like for you to follow up with pulmonology and let her know that you are experiencing some shortness of breath.   Start buspirone you will take this twice daily for your anxiety.    Regards,   Mort Sawyers FNP-C

## 2023-04-16 NOTE — Assessment & Plan Note (Signed)
Poc A1c in office today. Pt advised of the following: Work on a diabetic diet, try to incorporate exercise at least 20-30 a day for 3 days a week or more.

## 2023-04-16 NOTE — Assessment & Plan Note (Signed)
Advised to follow a gallbladder friendly diet.

## 2023-04-17 DIAGNOSIS — M25661 Stiffness of right knee, not elsewhere classified: Secondary | ICD-10-CM | POA: Diagnosis not present

## 2023-04-17 DIAGNOSIS — M25561 Pain in right knee: Secondary | ICD-10-CM | POA: Diagnosis not present

## 2023-04-18 ENCOUNTER — Encounter (INDEPENDENT_AMBULATORY_CARE_PROVIDER_SITE_OTHER): Payer: Self-pay

## 2023-04-19 NOTE — Telephone Encounter (Signed)
Call patient to discuss his symptoms, left message and sent mychart message

## 2023-04-24 DIAGNOSIS — M25661 Stiffness of right knee, not elsewhere classified: Secondary | ICD-10-CM | POA: Diagnosis not present

## 2023-04-24 DIAGNOSIS — M25561 Pain in right knee: Secondary | ICD-10-CM | POA: Diagnosis not present

## 2023-04-24 NOTE — Telephone Encounter (Signed)
If he just had surgery and has new shortness of breath symptoms he should have CXR and D-dimer to make sure he does not have pneumonia or pulmonary blood clot. Can you please order these two test re:sob

## 2023-04-24 NOTE — Telephone Encounter (Signed)
ATC patient x2 LVM asking him to call office or check his MyChart.   Orders placed as STAT.

## 2023-04-25 ENCOUNTER — Ambulatory Visit
Admission: RE | Admit: 2023-04-25 | Discharge: 2023-04-25 | Disposition: A | Discharge status: Home / Self Care | Payer: BC Managed Care – PPO | Attending: Primary Care | Admitting: Primary Care

## 2023-04-25 ENCOUNTER — Other Ambulatory Visit
Admission: RE | Admit: 2023-04-25 | Discharge: 2023-04-25 | Disposition: A | Discharge status: Home / Self Care | Payer: BC Managed Care – PPO | Source: Home / Self Care | Attending: Primary Care | Admitting: Primary Care

## 2023-04-25 ENCOUNTER — Ambulatory Visit
Admission: RE | Admit: 2023-04-25 | Discharge: 2023-04-25 | Disposition: A | Discharge status: Home / Self Care | Payer: BC Managed Care – PPO | Source: Ambulatory Visit | Attending: Primary Care | Admitting: Primary Care

## 2023-04-25 DIAGNOSIS — R0602 Shortness of breath: Secondary | ICD-10-CM

## 2023-04-25 DIAGNOSIS — J439 Emphysema, unspecified: Secondary | ICD-10-CM | POA: Diagnosis not present

## 2023-04-25 LAB — D-DIMER, QUANTITATIVE: D-Dimer, Quant: 1.47 ug{FEU}/mL — ABNORMAL HIGH (ref 0.00–0.50)

## 2023-04-25 NOTE — Addendum Note (Signed)
Addended by: Lonell Face C on: 04/25/2023 12:00 PM   Modules accepted: Orders

## 2023-04-25 NOTE — Telephone Encounter (Signed)
Pt returning missed call. 

## 2023-04-25 NOTE — Addendum Note (Signed)
Addended by: Jama Flavors on: 04/25/2023 11:17 AM   Modules accepted: Orders

## 2023-04-25 NOTE — Telephone Encounter (Signed)
Patient got xray this am but not labs. He was turned away due to no order being in. I spoke with patient and gave his xray results. Advised he go back for lab. D Dimer added.

## 2023-04-25 NOTE — Progress Notes (Signed)
Please let patient know CXR showed no acute cardiopulmonary process. No evidence of pneumonia, pleural effusion or pneumothorax. Will follow-up after labs

## 2023-04-26 DIAGNOSIS — M25661 Stiffness of right knee, not elsewhere classified: Secondary | ICD-10-CM | POA: Diagnosis not present

## 2023-04-26 DIAGNOSIS — M25561 Pain in right knee: Secondary | ICD-10-CM | POA: Diagnosis not present

## 2023-04-30 DIAGNOSIS — M25661 Stiffness of right knee, not elsewhere classified: Secondary | ICD-10-CM | POA: Diagnosis not present

## 2023-04-30 DIAGNOSIS — M25561 Pain in right knee: Secondary | ICD-10-CM | POA: Diagnosis not present

## 2023-05-01 ENCOUNTER — Telehealth: Payer: Self-pay | Admitting: Primary Care

## 2023-05-02 DIAGNOSIS — M25661 Stiffness of right knee, not elsewhere classified: Secondary | ICD-10-CM | POA: Diagnosis not present

## 2023-05-02 DIAGNOSIS — M25561 Pain in right knee: Secondary | ICD-10-CM | POA: Diagnosis not present

## 2023-05-06 ENCOUNTER — Other Ambulatory Visit: Payer: Self-pay | Admitting: Family

## 2023-05-06 DIAGNOSIS — M5412 Radiculopathy, cervical region: Secondary | ICD-10-CM

## 2023-05-06 DIAGNOSIS — G4733 Obstructive sleep apnea (adult) (pediatric): Secondary | ICD-10-CM | POA: Diagnosis not present

## 2023-05-08 ENCOUNTER — Other Ambulatory Visit: Payer: Self-pay

## 2023-05-08 ENCOUNTER — Ambulatory Visit: Admission: RE | Admit: 2023-05-08 | Payer: BC Managed Care – PPO | Source: Ambulatory Visit

## 2023-05-08 DIAGNOSIS — R7989 Other specified abnormal findings of blood chemistry: Secondary | ICD-10-CM

## 2023-05-08 NOTE — Progress Notes (Signed)
Per Beth elevated D-dimer.  Place order for CTA.

## 2023-05-08 NOTE — Telephone Encounter (Addendum)
Called patient this am.  Gave patient information and scheduled CTA as urgent order.  Patient verbalized understanding and will await a call to schedule CTA.    Update:  CTA was scheduled for today at 12:00 at [facility].

## 2023-05-08 NOTE — Telephone Encounter (Signed)
CT-A was done.

## 2023-05-08 NOTE — Telephone Encounter (Signed)
I sent message to Amy or Triage. D-dimer was positive. Needs CTA rule out PE please

## 2023-05-08 NOTE — Progress Notes (Signed)
Please let patient know D-dimer was just resulted to me, I apologize. It was elevated, needs CTA. Please order. Thanks

## 2023-05-08 NOTE — Telephone Encounter (Signed)
[name]'s pt. Will let her address.

## 2023-05-09 ENCOUNTER — Other Ambulatory Visit: Payer: Self-pay | Admitting: Family

## 2023-05-09 ENCOUNTER — Ambulatory Visit
Admission: RE | Admit: 2023-05-09 | Discharge: 2023-05-09 | Disposition: A | Discharge status: Home / Self Care | Payer: BC Managed Care – PPO | Source: Ambulatory Visit | Attending: Primary Care | Admitting: Primary Care

## 2023-05-09 DIAGNOSIS — K8020 Calculus of gallbladder without cholecystitis without obstruction: Secondary | ICD-10-CM | POA: Diagnosis not present

## 2023-05-09 DIAGNOSIS — R7989 Other specified abnormal findings of blood chemistry: Secondary | ICD-10-CM | POA: Diagnosis not present

## 2023-05-09 DIAGNOSIS — R0602 Shortness of breath: Secondary | ICD-10-CM | POA: Diagnosis not present

## 2023-05-09 DIAGNOSIS — F419 Anxiety disorder, unspecified: Secondary | ICD-10-CM

## 2023-05-09 MED ORDER — IOHEXOL 350 MG/ML SOLN
75.0000 mL | Freq: Once | INTRAVENOUS | Status: AC | PRN
  Administered 2023-05-09: 75 mL via INTRAVENOUS

## 2023-05-10 NOTE — Progress Notes (Signed)
Please let patient know CTA negative for PE, clear lungs. Small gallstone

## 2023-06-03 ENCOUNTER — Encounter: Payer: Self-pay | Admitting: Family

## 2023-06-03 ENCOUNTER — Ambulatory Visit (INDEPENDENT_AMBULATORY_CARE_PROVIDER_SITE_OTHER): Payer: BC Managed Care – PPO | Admitting: Family

## 2023-06-03 VITALS — BP 132/82 | HR 80 | Temp 97.8°F | Ht 70.0 in | Wt 296.6 lb

## 2023-06-03 DIAGNOSIS — M79604 Pain in right leg: Secondary | ICD-10-CM | POA: Diagnosis not present

## 2023-06-03 DIAGNOSIS — R7989 Other specified abnormal findings of blood chemistry: Secondary | ICD-10-CM | POA: Diagnosis not present

## 2023-06-03 DIAGNOSIS — F419 Anxiety disorder, unspecified: Secondary | ICD-10-CM

## 2023-06-03 NOTE — Patient Instructions (Signed)
  I have ordered imaging for you at [facility] outpatient diagnostic center. This order has been sent over for you electronically. This is for an ultrasound of your right lower leg to rule out a blood clot. Schedule this is D dimer still positive that we are ordering today.  Please call [phone] to schedule this appointment.   ------------------------------------

## 2023-06-03 NOTE — Assessment & Plan Note (Signed)
U/s venous doppler rle ext ordered, pt would prefer to wait for D dimer to come back and if still positive will then get the u/s. Pt aware to monitor for sudden sob and or chest pain and to call 911 if this is to occur.

## 2023-06-03 NOTE — Assessment & Plan Note (Signed)
Continue buspirone 5 mg twice daily.

## 2023-06-03 NOTE — Progress Notes (Signed)
Established Patient Office Visit  Subjective:      CC:  Chief Complaint  Patient presents with   Medical Management of Chronic Issues    HPI: [name] is a 57 y.o. male presenting on 06/03/2023 for Medical Management of Chronic Issues  Anxiety depression: trial start buspirone 5 mg twice daily and pt states this has been very helpful.   DOE, saw pulmonary since last visit 04/2023 and D dimer test was performed and D dimer was elevated. They did a CTA (CXR had been negative 8/22), and it was negative for PE. He does state there is some tenderness in the right lower leg as well as some redness. He did have a recent right knee surgery which puts him at higher risk for DVT.       Social history:  Relevant past medical, surgical, family and social history reviewed and updated as indicated. Interim medical history since our last visit reviewed.  Allergies and medications reviewed and updated.  DATA REVIEWED: CHART IN EPIC     ROS: Negative unless specifically indicated above in HPI.    Current Outpatient Medications:    busPIRone (BUSPAR) 5 MG tablet, TAKE 1 TABLET BY MOUTH TWICE A DAY, Disp: 180 tablet, Rfl: 1   celecoxib (CELEBREX) 200 MG capsule, Take 200 mg by mouth 2 (two) times daily., Disp: , Rfl:    melatonin 5 MG TABS, Take 1/2-1 tablet at bedtime for insomnia, Disp: 30 tablet, Rfl: 3   pregabalin (LYRICA) 75 MG capsule, TAKE 1 CAPSULE BY MOUTH TWICE A DAY, Disp: 60 capsule, Rfl: 0   Tiotropium Bromide Monohydrate (SPIRIVA RESPIMAT) 2.5 MCG/ACT AERS, Inhale 2 puffs into the lungs daily., Disp: 4 g, Rfl: 11      Objective:    BP 132/82 (BP Location: Left Arm, Patient Position: Sitting, Cuff Size: Large)   Pulse 80   Temp 97.8 F (36.6 C) (Temporal)   Ht 5\' 10"  (1.778 m)   Wt 296 lb 9.6 oz (134.5 kg)   SpO2 99%   BMI 42.56 kg/m   Wt Readings from Last 3 Encounters:  06/03/23 296 lb 9.6 oz (134.5 kg)  04/16/23 285 lb 3.2 oz (129.4 kg)  04/09/23 284  lb (128.8 kg)    Physical Exam Vitals reviewed.  Cardiovascular:     Comments: Mild erythema and warmth right lower lateral lower ext Pulmonary:     Breath sounds: Normal breath sounds.  Musculoskeletal:     Right lower leg: 1+ Pitting Edema present.     Left lower leg: 1+ Edema present.     Wt Readings from Last 3 Encounters:  06/03/23 296 lb 9.6 oz (134.5 kg)  04/16/23 285 lb 3.2 oz (129.4 kg)  04/09/23 284 lb (128.8 kg)   Temp Readings from Last 3 Encounters:  06/03/23 97.8 F (36.6 C) (Temporal)  04/16/23 97.6 F (36.4 C) (Oral)  04/01/23 98.2 F (36.8 C) (Temporal)   BP Readings from Last 3 Encounters:  06/03/23 132/82  04/16/23 124/86  04/01/23 136/78   Pulse Readings from Last 3 Encounters:  06/03/23 80  04/16/23 80  04/01/23 79    Wt Readings from Last 3 Encounters:  06/03/23 296 lb 9.6 oz (134.5 kg)  04/16/23 285 lb 3.2 oz (129.4 kg)  04/09/23 284 lb (128.8 kg)          Assessment & Plan:  Positive D dimer -     US Venous Img Lower Unilateral Right (DVT); Future -  D-dimer, quantitative  Acute pain of right lower extremity Assessment & Plan: U/s venous doppler rle ext ordered, pt would prefer to wait for D dimer to come back and if still positive will then get the u/s. Pt aware to monitor for sudden sob and or chest pain and to call 911 if this is to occur.  Orders: -     US Venous Img Lower Unilateral Right (DVT); Future -     D-dimer, quantitative  Anxiety Assessment & Plan: Continue buspirone 5 mg twice daily.      Return in about 6 months (around 12/01/2023) for f/u anxiety .  Mort Sawyers, MSN, APRN, FNP-C [facility]

## 2023-06-04 ENCOUNTER — Telehealth: Payer: Self-pay | Admitting: Internal Medicine

## 2023-06-04 ENCOUNTER — Ambulatory Visit
Admission: RE | Admit: 2023-06-04 | Discharge: 2023-06-04 | Disposition: A | Discharge status: Home / Self Care | Payer: BC Managed Care – PPO | Source: Ambulatory Visit | Attending: Family | Admitting: Family

## 2023-06-04 DIAGNOSIS — R7989 Other specified abnormal findings of blood chemistry: Secondary | ICD-10-CM | POA: Insufficient documentation

## 2023-06-04 DIAGNOSIS — I82401 Acute embolism and thrombosis of unspecified deep veins of right lower extremity: Secondary | ICD-10-CM | POA: Diagnosis not present

## 2023-06-04 DIAGNOSIS — M79604 Pain in right leg: Secondary | ICD-10-CM | POA: Diagnosis not present

## 2023-06-04 LAB — D-DIMER, QUANTITATIVE: D-Dimer, Quant: 1.99 ug{FEU}/mL — ABNORMAL HIGH (ref <0.50)

## 2023-06-04 NOTE — Addendum Note (Signed)
Addended by: Mort Sawyers on: 06/04/2023 01:23 PM   Modules accepted: Orders

## 2023-06-04 NOTE — Telephone Encounter (Signed)
See note below access note by Dr Lawerance Bach. Sending note to Hayden Pedro FNP, [place]  pool and will teams [place] CMA./

## 2023-06-04 NOTE — Telephone Encounter (Signed)
Seen this am, and pt set up for appt 1600 for stat u/s to r/o DVt

## 2023-06-04 NOTE — Telephone Encounter (Signed)
FYI -- Received a call from team health this morning at 7:30 AM regarding D-dimer from yesterday that is positive 1.99.  This was drawn yesterday.  Ultrasound of right lower extremity ordered to rule out DVT.  Will have PCP know and determine further evaluation necessary.

## 2023-06-04 NOTE — Telephone Encounter (Signed)
[name] with [name] called report for US venous rt lower leg; report is in epic; negative for DVT. Pt is not waiting.  I spoke with Hayden Pedro FNP ands sending note to [place].

## 2023-06-05 DIAGNOSIS — G4733 Obstructive sleep apnea (adult) (pediatric): Secondary | ICD-10-CM | POA: Diagnosis not present

## 2023-06-05 NOTE — Telephone Encounter (Signed)
Received call from [facility] yesterday with report, given verbally. Noted and responded via mychart to patient

## 2023-07-03 ENCOUNTER — Telehealth: Payer: BC Managed Care – PPO | Admitting: Primary Care

## 2023-07-06 DIAGNOSIS — G4733 Obstructive sleep apnea (adult) (pediatric): Secondary | ICD-10-CM | POA: Diagnosis not present

## 2023-07-14 ENCOUNTER — Other Ambulatory Visit: Payer: Self-pay | Admitting: Family

## 2023-07-14 DIAGNOSIS — M5412 Radiculopathy, cervical region: Secondary | ICD-10-CM

## 2023-07-15 ENCOUNTER — Other Ambulatory Visit: Payer: Self-pay | Admitting: Family

## 2023-07-15 ENCOUNTER — Other Ambulatory Visit: Payer: Self-pay | Admitting: *Deleted

## 2023-07-15 DIAGNOSIS — M5412 Radiculopathy, cervical region: Secondary | ICD-10-CM

## 2023-07-15 MED ORDER — PREGABALIN 75 MG PO CAPS
75.0000 mg | ORAL_CAPSULE | Freq: Two times a day (BID) | ORAL | 0 refills | Status: DC
Start: 2023-07-15 — End: 2023-07-16

## 2023-07-16 ENCOUNTER — Other Ambulatory Visit: Payer: Self-pay | Admitting: Family

## 2023-07-16 DIAGNOSIS — M5412 Radiculopathy, cervical region: Secondary | ICD-10-CM

## 2023-07-16 MED ORDER — PREGABALIN 75 MG PO CAPS
75.0000 mg | ORAL_CAPSULE | Freq: Two times a day (BID) | ORAL | 2 refills | Status: DC
Start: 2023-07-16 — End: 2023-10-18

## 2023-07-19 ENCOUNTER — Telehealth (INDEPENDENT_AMBULATORY_CARE_PROVIDER_SITE_OTHER): Payer: BC Managed Care – PPO | Admitting: Primary Care

## 2023-07-19 DIAGNOSIS — J439 Emphysema, unspecified: Secondary | ICD-10-CM

## 2023-07-19 DIAGNOSIS — R911 Solitary pulmonary nodule: Secondary | ICD-10-CM | POA: Diagnosis not present

## 2023-07-19 DIAGNOSIS — R0602 Shortness of breath: Secondary | ICD-10-CM

## 2023-07-19 DIAGNOSIS — G4733 Obstructive sleep apnea (adult) (pediatric): Secondary | ICD-10-CM

## 2023-07-19 DIAGNOSIS — Z87891 Personal history of nicotine dependence: Secondary | ICD-10-CM

## 2023-07-19 MED ORDER — SPIRIVA RESPIMAT 2.5 MCG/ACT IN AERS: 2.0000 | INHALATION_SPRAY | Freq: Every day | RESPIRATORY_TRACT | 11 refills | Status: DC

## 2023-07-19 NOTE — Patient Instructions (Signed)
Refill spiriva Increase CPAP pressure 13cm h20 Get updated PFTs Follow-up in 3 months or sooner

## 2023-07-19 NOTE — Progress Notes (Signed)
Virtual Visit via Video Note  I connected with Eston Mould on 07/19/23 at  2:30 PM EST by a video enabled telemedicine application and verified that I am speaking with the correct person using two identifiers.  Location: Patient: Home Provider: Office    I discussed the limitations of evaluation and management by telemedicine and the availability of in person appointments. The patient expressed understanding and agreed to proceed.  History of Present Illness:  57 year old male, former smoker quit in 2016 (30-pack-year history).  Past medical history significant for emphysema, obstructive sleep apnea.  Seen for initial sleep consult by Elisha Headland, NP on 09/05/2020.  Sleep study in 2017 showed severe obstructive sleep apnea with average AHI 51/hr (supine position AHI 128).  Referred to lung cancer screening program ordered for PFTs.  Previous [facility] pulmonary encounter: 12/28/2020 Patient presents today for 88-month follow-up. Patient has a history of severe obstructive sleep apnea. He is compliant with CPAP. Uses full face, its been > 6 months since he changed. He is having a lot of airleaks. Gets CPAP supplies from [place]. He reports shallow breathing and feels he runs out of air with activity. He mows his lawn wearing a mask. He uses albuterol every other day without significant improvement.     Airview download 11/28/20- 12/27/20 28/30 days (93%); 93% > 4 hours Average usage 5 hours 41 mins Pressure 13cm h20 Airleaks 77.5L/min AHI 0.8  03/01/2021 Patient presents today for 2 month follow-up. He is continues to have some dyspnea symptoms, but overall they are some better. Associated nocturnal wheezing. He denies cough. He is aware that he needs to work on weight loss. PFTs did not showed evidence of obstructive or restrictive lung disease. LDCT showed centrilobular emphysema.   04/01/2023 Patient presents today for an overdue follow-up.  He has not been seen by our office since June 2022.  History of severe obstructive sleep apnea on CPAP and emphysema. He had knee surgery in June, CPAP compliance during this time was fair. He sleeps better in a recliner. He goes to bed around 9pm and typically wakes up 4-5 hours later around 2am. A lot of times he will have trouble falling back to sleep. He starts his day at 3:30am. He drives trucks, he has his CDL license. He has to be at work by [employer]. Around 9am-11am he will feel tired but otherwise he's ok.  He continues to have some moderate dyspnea symptoms with exertion.  He is a former smoker. PFTs did not show evidence of obstructive or restrictive lung disease.  He has centrilobular emphysema on CT imaging.  He was following with lung cancer screening program, overdue for low dose CT.   Airview download 425/24-7/23/24 68/90 days (76%); 23 days (26%) greater than 4 hours Average usage days used 3 hours 5 minutes Pressure 12.4 cm H2O Air leak 17.8 L/min (95%) AHI 2.0  07/19/2023- interim hx  Patient contacted today for virtual visit for OSA/emphysema   He is sleeping alright at night. He is compliant with CPAP use No issues with CPAP machine or mask fit. Feels pressure could be increased  No issues snoring or waking up choking  Wakes up average 1-2 times a night He has gained some weight recently  Current pressure 12cm h20 Still having issues with dyspnea. Needs refill Spiriva   Airview download 06/18/2023 - 07/17/2023 Usage days 30/30 days (100%); 23 days (77%) greater than 4 hours Average usage 4 hours 58 minutes Pressure 12 cm H2O Air leaks 4.7  L/min (95%) AHI 5.3  Imaging:  11/04/2020 LDCT>> showed lung RADS 2, benign appearance of behavior of lung nodules.  Continue annual screening with low-dose CT chest.  Scattered tiny bilateral pulmonary nodules measuring up to maximum 3.5 mm.  Centrilobular emphysema noted.   Observations/Objective:  Appears well; Video was breaking up some while speaking to patient   Assessment and  Plan:  OSA - Patient is compliant with CPAP and reports benefit from use - Weight has increased some recently  - Current pressure 12cm h2; Residual AHI 5.4 - Recommend increased pressure settings 13cm h20 - FU in 3 months or sooner   Emphysema: - Still experiencing dyspnea symptoms with exertion  - Needs refill Spiriva Respimat 2.58mcg  - Get updated PFTs   Lung nodule -Following with lung cancer screening program -CT scan 04/09/2023 lung RADS 2, continue annual low-dose CT screening in 12 months, emphysema  Follow Up Instructions:   3 months or sooner if needed   I discussed the assessment and treatment plan with the patient. The patient was provided an opportunity to ask questions and all were answered. The patient agreed with the plan and demonstrated an understanding of the instructions.   The patient was advised to call back or seek an in-person evaluation if the symptoms worsen or if the condition fails to improve as anticipated.  I provided 22 minutes of non-face-to-face time during this encounter.   Glenford Bayley, NP

## 2023-07-29 ENCOUNTER — Other Ambulatory Visit: Payer: Self-pay | Admitting: Primary Care

## 2023-09-19 ENCOUNTER — Other Ambulatory Visit: Payer: Self-pay | Admitting: Family

## 2023-09-19 ENCOUNTER — Other Ambulatory Visit: Payer: Self-pay

## 2023-09-19 MED ORDER — CELECOXIB 200 MG PO CAPS
200.0000 mg | ORAL_CAPSULE | Freq: Two times a day (BID) | ORAL | 2 refills | Status: DC
  Filled 2023-09-19: qty 60, 30d supply, fill #0

## 2023-10-04 ENCOUNTER — Other Ambulatory Visit: Payer: Self-pay

## 2023-10-18 ENCOUNTER — Encounter: Payer: Self-pay | Admitting: Family

## 2023-10-18 ENCOUNTER — Other Ambulatory Visit: Payer: Self-pay | Admitting: Nurse Practitioner

## 2023-10-18 ENCOUNTER — Ambulatory Visit: Payer: BC Managed Care – PPO | Admitting: Family

## 2023-10-18 ENCOUNTER — Ambulatory Visit
Admission: RE | Admit: 2023-10-18 | Discharge: 2023-10-18 | Disposition: A | Discharge status: Home / Self Care | Payer: BC Managed Care – PPO | Source: Ambulatory Visit | Attending: Nurse Practitioner | Admitting: Nurse Practitioner

## 2023-10-18 ENCOUNTER — Telehealth: Payer: Self-pay

## 2023-10-18 ENCOUNTER — Telehealth: Payer: Self-pay | Admitting: Family

## 2023-10-18 ENCOUNTER — Ambulatory Visit (INDEPENDENT_AMBULATORY_CARE_PROVIDER_SITE_OTHER)
Admission: RE | Admit: 2023-10-18 | Discharge: 2023-10-18 | Disposition: A | Discharge status: Home / Self Care | Payer: BC Managed Care – PPO | Source: Ambulatory Visit | Attending: Family | Admitting: Family

## 2023-10-18 ENCOUNTER — Other Ambulatory Visit: Payer: Self-pay

## 2023-10-18 VITALS — BP 142/105 | HR 65 | Temp 97.9°F | Ht 70.0 in | Wt 318.8 lb

## 2023-10-18 DIAGNOSIS — R7989 Other specified abnormal findings of blood chemistry: Secondary | ICD-10-CM | POA: Diagnosis not present

## 2023-10-18 DIAGNOSIS — R0600 Dyspnea, unspecified: Secondary | ICD-10-CM | POA: Diagnosis not present

## 2023-10-18 DIAGNOSIS — R635 Abnormal weight gain: Secondary | ICD-10-CM | POA: Diagnosis not present

## 2023-10-18 DIAGNOSIS — R591 Generalized enlarged lymph nodes: Secondary | ICD-10-CM | POA: Diagnosis not present

## 2023-10-18 DIAGNOSIS — R972 Elevated prostate specific antigen [PSA]: Secondary | ICD-10-CM

## 2023-10-18 DIAGNOSIS — M5412 Radiculopathy, cervical region: Secondary | ICD-10-CM

## 2023-10-18 DIAGNOSIS — K8020 Calculus of gallbladder without cholecystitis without obstruction: Secondary | ICD-10-CM | POA: Diagnosis not present

## 2023-10-18 DIAGNOSIS — R0609 Other forms of dyspnea: Secondary | ICD-10-CM

## 2023-10-18 DIAGNOSIS — R918 Other nonspecific abnormal finding of lung field: Secondary | ICD-10-CM | POA: Diagnosis not present

## 2023-10-18 DIAGNOSIS — K760 Fatty (change of) liver, not elsewhere classified: Secondary | ICD-10-CM | POA: Diagnosis not present

## 2023-10-18 DIAGNOSIS — R600 Localized edema: Secondary | ICD-10-CM | POA: Diagnosis not present

## 2023-10-18 DIAGNOSIS — G4733 Obstructive sleep apnea (adult) (pediatric): Secondary | ICD-10-CM | POA: Diagnosis not present

## 2023-10-18 DIAGNOSIS — R739 Hyperglycemia, unspecified: Secondary | ICD-10-CM | POA: Diagnosis not present

## 2023-10-18 DIAGNOSIS — R030 Elevated blood-pressure reading, without diagnosis of hypertension: Secondary | ICD-10-CM | POA: Insufficient documentation

## 2023-10-18 LAB — COMPREHENSIVE METABOLIC PANEL
ALT: 28 U/L (ref 0–53)
AST: 23 U/L (ref 0–37)
Albumin: 4.3 g/dL (ref 3.5–5.2)
Alkaline Phosphatase: 47 U/L (ref 39–117)
BUN: 12 mg/dL (ref 6–23)
CO2: 28 meq/L (ref 19–32)
Calcium: 8.8 mg/dL (ref 8.4–10.5)
Chloride: 103 meq/L (ref 96–112)
Creatinine, Ser: 1 mg/dL (ref 0.40–1.50)
GFR: 84.68 mL/min (ref >60.00)
Glucose, Bld: 99 mg/dL (ref 70–99)
Potassium: 4.1 meq/L (ref 3.5–5.1)
Sodium: 139 meq/L (ref 135–145)
Total Bilirubin: 1.1 mg/dL (ref 0.2–1.2)
Total Protein: 7 g/dL (ref 6.0–8.3)

## 2023-10-18 LAB — CBC WITH DIFFERENTIAL/PLATELET
Basophils Absolute: 0.1 10*3/uL (ref 0.0–0.1)
Basophils Relative: 1.8 % (ref 0.0–3.0)
Eosinophils Absolute: 0.5 10*3/uL (ref 0.0–0.7)
Eosinophils Relative: 7.4 % — ABNORMAL HIGH (ref 0.0–5.0)
HCT: 44.8 % (ref 39.0–52.0)
Hemoglobin: 15.4 g/dL (ref 13.0–17.0)
Lymphocytes Relative: 27.6 % (ref 12.0–46.0)
Lymphs Abs: 1.7 10*3/uL (ref 0.7–4.0)
MCHC: 34.3 g/dL (ref 30.0–36.0)
MCV: 90 fL (ref 78.0–100.0)
Monocytes Absolute: 0.4 10*3/uL (ref 0.1–1.0)
Monocytes Relative: 7 % (ref 3.0–12.0)
Neutro Abs: 3.5 10*3/uL (ref 1.4–7.7)
Neutrophils Relative %: 56.2 % (ref 43.0–77.0)
Platelets: 168 10*3/uL (ref 150.0–400.0)
RBC: 4.98 Mil/uL (ref 4.22–5.81)
RDW: 14.7 % (ref 11.5–15.5)
WBC: 6.2 10*3/uL (ref 4.0–10.5)

## 2023-10-18 LAB — TSH: TSH: 1.69 u[IU]/mL (ref 0.35–5.50)

## 2023-10-18 LAB — BRAIN NATRIURETIC PEPTIDE: Pro B Natriuretic peptide (BNP): 29 pg/mL (ref 0.0–100.0)

## 2023-10-18 LAB — T4, FREE: Free T4: 0.8 ng/dL (ref 0.60–1.60)

## 2023-10-18 LAB — T3, FREE: T3, Free: 3.8 pg/mL (ref 2.3–4.2)

## 2023-10-18 MED ORDER — PREGABALIN 100 MG PO CAPS
100.0000 mg | ORAL_CAPSULE | Freq: Two times a day (BID) | ORAL | 0 refills | Status: DC
Start: 2023-10-18 — End: 2023-11-04

## 2023-10-18 MED ORDER — IOHEXOL 350 MG/ML SOLN
100.0000 mL | Freq: Once | INTRAVENOUS | Status: AC | PRN
  Administered 2023-10-18: 100 mL via INTRAVENOUS

## 2023-10-18 NOTE — Assessment & Plan Note (Signed)
Pt advised of the following:  Continue medication as prescribed. Monitor blood pressure periodically and/or when you feel symptomatic. Goal is <130/90 on average. Ensure that you have rested for 30 minutes prior to checking your blood pressure. Record your readings and bring them to your next visit if necessary.work on a low sodium diet.

## 2023-10-18 NOTE — Assessment & Plan Note (Signed)
Multiple sites on physical exam  Mass on bil upper shoulders supraclavicular lymphadenopathy vs lipoma Ordering CBC CXR LDH  R/o infectious process vs neoplasm If neg findings will consider u/s for possible lipoma

## 2023-10-18 NOTE — Assessment & Plan Note (Signed)
Repeat again today pending results.

## 2023-10-18 NOTE — Progress Notes (Signed)
Established Patient Office Visit  Subjective:      CC:  Chief Complaint  Patient presents with   Medical Management of Chronic Issues    HPI: [name] is a 58 y.o. male presenting on 10/18/2023 for Medical Management of Chronic Issues  Abn weight gain, states has been eating more but not a ton more than usual. He does have fatigue but he states this has not changed. He does notice some sob with walking, doe. More so than usual. No chest pain or palpitations. He does notice bil swelling in lower extremities. Has gained about 26 pounds since 9/25  Sob is also seen by pulmonary last visit 07/19/23 ongoing doe, on spiriva respimat 2.5 mcg once daily. Last visit increased CPAP pressure for OSA  Unrelated, He states he has tingling on bil upper ext and at times on the right lower ext from his lower back as he does have spinal stenosis.    Wt Readings from Last 3 Encounters:  10/18/23 (!) 318 lb 12.8 oz (144.6 kg)  06/03/23 296 lb 9.6 oz (134.5 kg)  04/16/23 285 lb 3.2 oz (129.4 kg)    Cervical radiculopathy, doing well on lyrica 75 mg twice daily.   Has noticed two lumps on her bil lower neck with increasing size. He states the one on the right side hurts at times.   Social history:  Relevant past medical, surgical, family and social history reviewed and updated as indicated. Interim medical history since our last visit reviewed.  Allergies and medications reviewed and updated.  DATA REVIEWED: CHART IN EPIC     ROS: Negative unless specifically indicated above in HPI.    Current Outpatient Medications:    busPIRone (BUSPAR) 5 MG tablet, TAKE 1 TABLET BY MOUTH TWICE A DAY, Disp: 180 tablet, Rfl: 1   celecoxib (CELEBREX) 200 MG capsule, Take 1 capsule (200 mg total) by mouth 2 (two) times daily., Disp: 60 capsule, Rfl: 2   pregabalin (LYRICA) 100 MG capsule, Take 1 capsule (100 mg total) by mouth 2 (two) times daily., Disp: 60 capsule, Rfl: 0   Tiotropium Bromide  Monohydrate (SPIRIVA RESPIMAT) 2.5 MCG/ACT AERS, Inhale 2 puffs into the lungs daily., Disp: 4 g, Rfl: 11      Objective:    BP (!) 142/105   Pulse 65   Temp 97.9 F (36.6 C) (Temporal)   Ht 5\' 10"  (1.778 m)   Wt (!) 318 lb 12.8 oz (144.6 kg)   SpO2 98%   BMI 45.74 kg/m   Wt Readings from Last 3 Encounters:  10/18/23 (!) 318 lb 12.8 oz (144.6 kg)  06/03/23 296 lb 9.6 oz (134.5 kg)  04/16/23 285 lb 3.2 oz (129.4 kg)    Physical Exam Constitutional:      General: He is not in acute distress.    Appearance: Normal appearance. He is normal weight. He is not ill-appearing, toxic-appearing or diaphoretic.  Cardiovascular:     Rate and Rhythm: Normal rate and regular rhythm.  Pulmonary:     Effort: Pulmonary effort is normal.     Breath sounds: Normal breath sounds.  Musculoskeletal:        General: Normal range of motion.     Right lower leg: No edema.     Left lower leg: No edema.  Lymphadenopathy:     Cervical: Cervical adenopathy present.     Right cervical: Superficial cervical adenopathy present.     Left cervical: Superficial cervical adenopathy present.  Upper Body:     Right upper body: Supraclavicular adenopathy present.     Left upper body: Supraclavicular adenopathy and axillary adenopathy present.  Skin:    Comments: Two enlarged boggy masses bil upper shoulders at base of neckline   Neurological:     General: No focal deficit present.     Mental Status: He is alert and oriented to person, place, and time. Mental status is at baseline.  Psychiatric:        Mood and Affect: Mood normal.        Behavior: Behavior normal.        Thought Content: Thought content normal.        Judgment: Judgment normal.           Assessment & Plan:  Obstructive sleep apnea syndrome Assessment & Plan: Continue with cpap and f/u with pulmonary as scheduled.    Morbid obesity (HCC) Assessment & Plan: With recent abn weight gain  Ordering thyroid panel r/o thyroid  disease Could also consider lyrica as a component of weight gain, unsure Pending labs Bnp ordered r/o CHF   Hyperglycemia -     Comprehensive metabolic panel  Positive D dimer Assessment & Plan: Repeat again today pending results.  Orders: -     D-dimer, quantitative  Cervical radiculopathy -     Pregabalin; Take 1 capsule (100 mg total) by mouth 2 (two) times daily.  Dispense: 60 capsule; Refill: 0  Abnormal weight gain Assessment & Plan: Thyroid panel ordered pending results.  Could be fluid over load with some slight doe low suspicion however r/o with bnp  Orders: -     Brain natriuretic peptide -     TSH -     T4, free -     T3, free  Pedal edema -     Brain natriuretic peptide  DOE (dyspnea on exertion) -     Brain natriuretic peptide  Elevated blood pressure reading in office without diagnosis of hypertension Assessment & Plan: Pt advised of the following:  Continue medication as prescribed. Monitor blood pressure periodically and/or when you feel symptomatic. Goal is <130/90 on average. Ensure that you have rested for 30 minutes prior to checking your blood pressure. Record your readings and bring them to your next visit if necessary.work on a low sodium diet.    Lymphadenopathy Assessment & Plan: Multiple sites on physical exam  Mass on bil upper shoulders supraclavicular lymphadenopathy vs lipoma Ordering CBC CXR LDH  R/o infectious process vs neoplasm If neg findings will consider u/s for possible lipoma   Orders: -     Lactate dehydrogenase -     CBC with Differential/Platelet -     HIV Antibody (routine testing w rflx) -     DG Chest 2 View; Future  Elevated PSA Assessment & Plan: Repeat again today  Orders: -     PSA; Future     Return in about 10 days (around 10/28/2023) for f/u blood pressure.  Mort Sawyers, MSN, APRN, FNP-C [facility]

## 2023-10-18 NOTE — Addendum Note (Signed)
Addended by: Vincenza Hews on: 10/18/2023 09:59 AM   Modules accepted: Orders

## 2023-10-18 NOTE — Assessment & Plan Note (Signed)
Repeat again today.

## 2023-10-18 NOTE — Assessment & Plan Note (Signed)
Thyroid panel ordered pending results.  Could be fluid over load with some slight doe low suspicion however r/o with bnp

## 2023-10-18 NOTE — Telephone Encounter (Signed)
Called and left message on patients phone to call back  Did speak to wife Misty Stanley [facility]). Explained the results and patient was calling her. Told her to have him call the office back

## 2023-10-18 NOTE — Telephone Encounter (Signed)
Mick Sell from [employer] called with a critically high D-dimer at 1.02. Results are in his chart. Could not tell if Tabitha resulted it when she talked to the pt through MyChart earlier. Will forward to a provider in the office since Tabitha is out of the office this afternoon.

## 2023-10-18 NOTE — Assessment & Plan Note (Signed)
Continue with cpap and f/u with pulmonary as scheduled.

## 2023-10-18 NOTE — Telephone Encounter (Signed)
Can we please add on psa if able?

## 2023-10-18 NOTE — Assessment & Plan Note (Addendum)
With recent abn weight gain  Ordering thyroid panel r/o thyroid disease Could also consider lyrica as a component of weight gain, unsure Pending labs Bnp ordered r/o CHF

## 2023-10-18 NOTE — Telephone Encounter (Signed)
Pt has been scheduled for CTa due to high D Dimer.

## 2023-10-19 LAB — D-DIMER, QUANTITATIVE: D-Dimer, Quant: 1.02 ug{FEU}/mL — ABNORMAL HIGH (ref <0.50)

## 2023-10-19 LAB — HIV ANTIBODY (ROUTINE TESTING W REFLEX): HIV 1&2 Ab, 4th Generation: NONREACTIVE

## 2023-10-19 LAB — PSA: PSA: 1.22 ng/mL (ref <=4.00)

## 2023-10-19 LAB — LACTATE DEHYDROGENASE: LDH: 141 U/L (ref 120–250)

## 2023-10-20 ENCOUNTER — Encounter: Payer: Self-pay | Admitting: Family

## 2023-10-20 DIAGNOSIS — R590 Localized enlarged lymph nodes: Secondary | ICD-10-CM

## 2023-10-20 DIAGNOSIS — K2289 Other specified disease of esophagus: Secondary | ICD-10-CM

## 2023-10-20 DIAGNOSIS — R222 Localized swelling, mass and lump, trunk: Secondary | ICD-10-CM

## 2023-10-20 DIAGNOSIS — M2550 Pain in unspecified joint: Secondary | ICD-10-CM

## 2023-10-20 NOTE — Telephone Encounter (Signed)
Thanks Matt for taking care of this critical.reviewed CTA will follow up with patient as well

## 2023-10-21 ENCOUNTER — Encounter: Payer: Self-pay | Admitting: Family

## 2023-10-22 ENCOUNTER — Ambulatory Visit: Payer: BC Managed Care – PPO

## 2023-10-22 ENCOUNTER — Telehealth: Payer: Self-pay | Admitting: Family

## 2023-10-22 DIAGNOSIS — M2550 Pain in unspecified joint: Secondary | ICD-10-CM | POA: Diagnosis not present

## 2023-10-22 NOTE — Telephone Encounter (Signed)
Have you seen weight gain in patients on lyrica?  I am currently working up abn weight gain but so far everything comes back unremarkable and the only change in that weight gain time frame different has been starting lyrica?

## 2023-10-22 NOTE — Telephone Encounter (Signed)
May be too late but if able can we add ana?

## 2023-10-24 ENCOUNTER — Other Ambulatory Visit: Payer: Self-pay | Admitting: Family

## 2023-10-24 ENCOUNTER — Encounter: Payer: Self-pay | Admitting: Family

## 2023-10-24 DIAGNOSIS — M25531 Pain in right wrist: Secondary | ICD-10-CM | POA: Insufficient documentation

## 2023-10-24 DIAGNOSIS — F419 Anxiety disorder, unspecified: Secondary | ICD-10-CM

## 2023-10-24 DIAGNOSIS — M2550 Pain in unspecified joint: Secondary | ICD-10-CM

## 2023-10-24 DIAGNOSIS — M25511 Pain in right shoulder: Secondary | ICD-10-CM | POA: Insufficient documentation

## 2023-10-24 DIAGNOSIS — M62838 Other muscle spasm: Secondary | ICD-10-CM

## 2023-10-24 DIAGNOSIS — M5412 Radiculopathy, cervical region: Secondary | ICD-10-CM

## 2023-10-24 MED ORDER — PREDNISONE 10 MG (21) PO TBPK: ORAL_TABLET | ORAL | 0 refills | Status: DC

## 2023-10-24 MED ORDER — DULOXETINE HCL 30 MG PO CPEP: 30.0000 mg | ORAL_CAPSULE | Freq: Every day | ORAL | 0 refills | Status: DC

## 2023-10-24 MED ORDER — CYCLOBENZAPRINE HCL 10 MG PO TABS
ORAL_TABLET | ORAL | 0 refills | Status: DC
Start: 2023-10-24 — End: 2023-11-19

## 2023-10-24 NOTE — Progress Notes (Signed)
Called pt. Last visit 2/14 increased lyrica 100 mg twice daily He took three days off of work and felt a bit better but once he went back to work he started with the pain again. He does a lot of heavy lifting and pulling, and he also drives a truck. He is having a good amount of shoulder pain, right shoulder is the worst. Only slight pain when lifting upwards.   The shoulder does feel 'really heavy' which is also a weakness on the right side.   He also has some radiating pain from neck to tips of fingers that is tingling and shooting. Intensifies when he is driving his vehicle. He did see an orthopedist where they did an MRI that I do not have chart access too, and he states they said no acute findings. Unable to verify. He has had surgery three years ago for carpal tunnel surgery bil, with only slightly improvement for about a year.   He is also taking celebrex 200 mg twice daily.   Anxiety, buspirone is doing well at twice daily. He states has been less agitated.   Plan, we are going to start prednisone pack and muscle relaxer to help with pain. Will likely decrease lyrica. Start trial cymbalta. Referral placed for neurosurgery pt to make appt with dr copland as well to evaluate shoulder and neck.

## 2023-10-24 NOTE — Telephone Encounter (Signed)
Cancel keiths appt with me next Tuesday, and schedule him with dr. Patsy Lager. Please call pt

## 2023-10-24 NOTE — Telephone Encounter (Signed)
LVM for patient to cb and schedule with Copland.

## 2023-10-24 NOTE — Telephone Encounter (Signed)
Thank you so much for clarifying.  The poor guy has gained 30 pounds in just a few months.  His thyroid is completely normal.  He has tried gabapentin before without improvement in his neuropathy and Lyrica has been helping but now with this weight gain I need to consider another option, any recommendations?  Or if you prefer I am happy to put in a referral for you to evaluate further

## 2023-10-25 ENCOUNTER — Other Ambulatory Visit: Payer: Self-pay | Admitting: Family

## 2023-10-25 DIAGNOSIS — F419 Anxiety disorder, unspecified: Secondary | ICD-10-CM

## 2023-10-29 ENCOUNTER — Encounter: Payer: Self-pay | Admitting: Family

## 2023-10-29 ENCOUNTER — Ambulatory Visit: Payer: BC Managed Care – PPO | Admitting: Family

## 2023-10-29 LAB — ANA W/REFLEX: Anti Nuclear Antibody (ANA): NEGATIVE

## 2023-10-30 ENCOUNTER — Encounter: Payer: Self-pay | Admitting: *Deleted

## 2023-10-31 NOTE — Telephone Encounter (Signed)
 Thank you so much ! I really appreciate your recommendations.  So we wouldn't necessarily want TCA with cymbalta, it'd be one or the other I'm guessing. He just started cymbalta so just like you said my plan is to increase next visit as

## 2023-11-04 ENCOUNTER — Encounter: Payer: Self-pay | Admitting: Primary Care

## 2023-11-04 ENCOUNTER — Telehealth: Payer: Self-pay | Admitting: Primary Care

## 2023-11-04 ENCOUNTER — Ambulatory Visit: Payer: BC Managed Care – PPO | Admitting: Primary Care

## 2023-11-04 VITALS — BP 128/72 | HR 83 | Temp 98.6°F | Ht 71.0 in | Wt 312.4 lb

## 2023-11-04 DIAGNOSIS — R0602 Shortness of breath: Secondary | ICD-10-CM | POA: Diagnosis not present

## 2023-11-04 DIAGNOSIS — J439 Emphysema, unspecified: Secondary | ICD-10-CM

## 2023-11-04 DIAGNOSIS — G4733 Obstructive sleep apnea (adult) (pediatric): Secondary | ICD-10-CM

## 2023-11-04 MED ORDER — TRELEGY ELLIPTA 100-62.5-25 MCG/ACT IN AEPB: 1.0000 | INHALATION_SPRAY | Freq: Every day | RESPIRATORY_TRACT | Status: DC

## 2023-11-04 NOTE — Patient Instructions (Addendum)
-  SHORTNESS OF BREATH: Shortness of breath means you are having difficulty breathing. We will conduct a pulmonary function test at [facility] to check for any changes in your lungs. You will also try a sample of the Trelegy inhaler to see if it helps. We will have a virtual visit in two weeks to see how you are doing with the inhaler.  -EMPHYSEMA: Emphysema is a lung condition that causes shortness of breath. We will provide you with a sample of the Trelegy inhaler to see if it helps your symptoms. Additionally, we will conduct a pulmonary function test at [facility] to assess your lung function.  -PULMONARY HYPERTENSION RISK: Pulmonary hypertension is high blood pressure in the lungs, which can be a risk due to your sleep apnea. We will reorder an echocardiogram to check for this condition.  -SLEEP APNEA: Sleep apnea is a condition where your breathing stops and starts during sleep. Your sleep apnea is well-controlled with your CPAP machine, and you should continue using it every night.  INSTRUCTIONS:  Please schedule a pulmonary function test at [facility] and an echocardiogram as soon as possible. We will have a virtual visit in two weeks to evaluate your response to the Trelegy inhaler.  Follow-up 4-8 weeks with Waynetta Sandy NP (can be virtual)

## 2023-11-04 NOTE — Telephone Encounter (Signed)
 ATC PT X1 per message sent from Buelah Manis, NP. Mychart message also sent.

## 2023-11-04 NOTE — Progress Notes (Signed)
 @Patient  ID: [name], male    DOB: Jul 05, 1966, 58 y.o.   MRN: [id number]  Chief Complaint  Patient presents with   Follow-up    Referring provider: Mort Sawyers, FNP  HPI: 58 year old male, former smoker quit in 2016 (30-pack-year history).  Past medical history significant for emphysema, obstructive sleep apnea.  Seen for initial sleep consult by Elisha Headland, NP on 09/05/2020.  Sleep study in 2017 showed severe obstructive sleep apnea with average AHI 51/hr (supine position AHI 128).  Referred to lung cancer screening program ordered for PFTs.  Previous [facility] pulmonary encounter: 12/28/2020 Patient presents today for 96-month follow-up. Patient has a history of severe obstructive sleep apnea. He is compliant with CPAP. Uses full face, its been > 6 months since he changed. He is having a lot of airleaks. Gets CPAP supplies from [place]. He reports shallow breathing and feels he runs out of air with activity. He mows his lawn wearing a mask. He uses albuterol every other day without significant improvement.     Airview download 11/28/20- 12/27/20 28/30 days (93%); 93% > 4 hours Average usage 5 hours 41 mins Pressure 13cm h20 Airleaks 77.5L/min AHI 0.8  03/01/2021 Patient presents today for 2 month follow-up. He is continues to have some dyspnea symptoms, but overall they are some better. Associated nocturnal wheezing. He denies cough. He is aware that he needs to work on weight loss. PFTs did not showed evidence of obstructive or restrictive lung disease. LDCT showed centrilobular emphysema.   04/01/2023 Patient presents today for an overdue follow-up.  He has not been seen by our office since June 2022. History of severe obstructive sleep apnea on CPAP and emphysema. He had knee surgery in June, CPAP compliance during this time was fair. He sleeps better in a recliner. He goes to bed around 9pm and typically wakes up 4-5 hours later around 2am. A lot of times he will have trouble falling  back to sleep. He starts his day at 3:30am. He drives trucks, he has his CDL license. He has to be at work by [employer]. Around 9am-11am he will feel tired but otherwise he's ok.  He continues to have some moderate dyspnea symptoms with exertion.  He is a former smoker. PFTs did not show evidence of obstructive or restrictive lung disease.  He has centrilobular emphysema on CT imaging.  He was following with lung cancer screening program, overdue for low dose CT.   Airview download 425/24-7/23/24 68/90 days (76%); 23 days (26%) greater than 4 hours Average usage days used 3 hours 5 minutes Pressure 12.4 cm H2O Air leak 17.8 L/min (95%) AHI 2.0  07/19/2023 Patient contacted today for virtual visit for OSA/emphysema   He is sleeping alright at night. He is compliant with CPAP use No issues with CPAP machine or mask fit. Feels pressure could be increased  No issues snoring or waking up choking  Wakes up average 1-2 times a night He has gained some weight recently  Current pressure 12cm h20 Still having issues with dyspnea. Needs refill Spiriva   Airview download 06/18/2023 - 07/17/2023 Usage days 30/30 days (100%); 23 days (77%) greater than 4 hours Average usage 4 hours 58 minutes Pressure 12 cm H2O Air leaks 4.7 L/min (95%) AHI 5.3  OSA - Patient is compliant with CPAP and reports benefit from use - Weight has increased some recently  - Current pressure 12cm h2; Residual AHI 5.4 - Recommend increased pressure settings 13cm h20 - FU in 3  months or sooner   Emphysema: - Still experiencing dyspnea symptoms with exertion  - Needs refill Spiriva Respimat 2.68mcg  - Get updated PFTs   Lung nodule -Following with lung cancer screening program -CT scan 04/09/2023 lung RADS 2, continue annual low-dose CT screening in 12 months, emphysema   11/04/2023- Interim hx Discussed the use of AI scribe software for clinical note transcription with the patient, who gave verbal consent to  proceed.  Patient presents today for 3 month follow-up. Patient is followed by our office for OSA on CPAP, mild emphysema and scattered small pulmonary nodules. Patient had CTA in February following elevated D-dimer dur to shortness of breath which was negative for PE or acute intrathoracic abnormality. Continue to follow with annual LDCT.  He is maintained on Spiriva respimat 2 puffs daily.   He has been experiencing persistent shortness of breath, which initially improved with a course of prednisone prescribed last month but has since returned. He experiences significant dyspnea on exertion, becoming winded after walking short distances, such as from his car to the counter. Daily coughing and some wheezing are present, which were temporarily alleviated by prednisone.  He has a history of mild emphysema and was previously part of a lung cancer screening program due to his history of smoking and the presence of small pulmonary nodules. He quit smoking approximately eight years ago. His last pulmonary function test in 2022 showed a lung function of 84% with no evidence of obstructive or restrictive lung disease. In February, he underwent a CT scan of the chest due to elevated D-dimer levels and shortness of breath, which ruled out a pulmonary embolism and showed no acute abnormalities.  He has gained approximately 25 pounds, which he attributes to the use of Lyrica, a medication he has since discontinued. He denies current smoking.  He has sleep apnea, which is well-controlled with CPAP therapy. He uses the CPAP machine consistently for about four hours and fifty minutes per night, with a pressure setting of 13 and an apnea-hypopnea index of 4.1. He is currently on Cymbalta and Flexeril, which have improved his sleep quality.  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 His father had asthma.      Airview download 10/02/2023 -  10/31/2023 Usage days 30/30 days (100%) 22 days (73%) greater than 4 hours Average usage 4 hours 50 minutes Pressure 13 cm H2O Air leaks 18.3 L/min AHI 4.1   Imaging:  11/04/2020 LDCT>> showed lung RADS 2, benign appearance of behavior of lung nodules.  Continue annual screening with low-dose CT chest.  Scattered tiny bilateral pulmonary nodules measuring up to maximum 3.5 mm.  Centrilobular emphysema noted.   Allergies  Allergen Reactions   Penicillins Other (See Comments)   Oxycodone     Immunization History  Administered Date(s) Administered   Hepatitis B, ADULT 02/05/2017, 03/13/2017, 08/07/2017   Influenza,inj,Quad PF,6+ Mos 09/05/2020    Past Medical History:  Diagnosis Date   Sleep apnea     Tobacco History: Social History   Tobacco Use  Smoking Status Former   Current packs/day: 0.00   Average packs/day: 1 pack/day for 30.0 years (30.0 ttl pk-yrs)   Types: Cigarettes   Start date: 37   Quit date: 2016   Years since quitting: 9.1  Smokeless Tobacco Never   Counseling given: Not Answered   Outpatient Medications Prior to Visit  Medication Sig Dispense Refill   busPIRone (BUSPAR) 5 MG  tablet TAKE 1 TABLET BY MOUTH TWICE A DAY 180 tablet 2   cyclobenzaprine (FLEXERIL) 10 MG tablet Take 1/2 to one tablet at bedtime prn muscle spasm 30 tablet 0   DULoxetine (CYMBALTA) 30 MG capsule Take 1 capsule (30 mg total) by mouth daily. 30 capsule 0   predniSONE (STERAPRED UNI-PAK 21 TAB) 10 MG (21) TBPK tablet Take as directed 1 each 0   pregabalin (LYRICA) 100 MG capsule Take 1 capsule (100 mg total) by mouth 2 (two) times daily. 60 capsule 0   Tiotropium Bromide Monohydrate (SPIRIVA RESPIMAT) 2.5 MCG/ACT AERS Inhale 2 puffs into the lungs daily. 4 g 11   celecoxib (CELEBREX) 200 MG capsule Take 1 capsule (200 mg total) by mouth 2 (two) times daily. (Patient not taking: Reported on 11/04/2023) 60 capsule 2   No facility-administered medications prior to visit.   Review  of Systems  Review of Systems  Constitutional:  Positive for unexpected weight change.  HENT: Negative.    Respiratory:  Positive for shortness of breath and wheezing. Negative for cough.   Cardiovascular: Negative.    Physical Exam  There were no vitals taken for this visit. Physical Exam Constitutional:      General: He is not in acute distress.    Appearance: Normal appearance. He is not ill-appearing.  HENT:     Head: Normocephalic and atraumatic.     Mouth/Throat:     Mouth: Mucous membranes are moist.     Pharynx: Oropharynx is clear.  Cardiovascular:     Rate and Rhythm: Normal rate and regular rhythm.  Pulmonary:     Effort: Pulmonary effort is normal.     Breath sounds: Normal breath sounds.  Skin:    General: Skin is warm and dry.  Neurological:     General: No focal deficit present.     Mental Status: He is alert and oriented to person, place, and time. Mental status is at baseline.  Psychiatric:        Mood and Affect: Mood normal.        Behavior: Behavior normal.        Thought Content: Thought content normal.        Judgment: Judgment normal.      Lab Results:  CBC    Component Value Date/Time   WBC 6.2 10/18/2023 0821   RBC 4.98 10/18/2023 0821   HGB 15.4 10/18/2023 0821   HCT 44.8 10/18/2023 0821   PLT 168.0 10/18/2023 0821   MCV 90.0 10/18/2023 0821   MCH 30.3 03/22/2023 1100   MCHC 34.3 10/18/2023 0821   RDW 14.7 10/18/2023 0821   LYMPHSABS 1.7 10/18/2023 0821   MONOABS 0.4 10/18/2023 0821   EOSABS 0.5 10/18/2023 0821   BASOSABS 0.1 10/18/2023 0821    BMET    Component Value Date/Time   NA 139 10/18/2023 0821   K 4.1 10/18/2023 0821   CL 103 10/18/2023 0821   CO2 28 10/18/2023 0821   GLUCOSE 99 10/18/2023 0821   BUN 12 10/18/2023 0821   CREATININE 1.00 10/18/2023 0821   CREATININE 0.94 03/22/2023 1100   CALCIUM 8.8 10/18/2023 0821   GFRNONAA >60 05/09/2007 0410   GFRAA  05/09/2007 0410    >60        The eGFR has been  calculated using the MDRD equation. This calculation has not been validated in all clinical    BNP No results found for: "BNP"  ProBNP    Component Value Date/Time   PROBNP  29.0 10/18/2023 4782    Imaging: DG Chest 2 View Result Date: 10/18/2023 CLINICAL DATA:  Dyspnea. EXAM: CHEST - 2 VIEW COMPARISON:  Radiograph 04/25/2023. Subsequent chest CTA, available at time of radiograph interpretation. FINDINGS: The cardiomediastinal contours are normal. The lungs are clear. Pulmonary vasculature is normal. No consolidation, pleural effusion, or pneumothorax. No acute osseous abnormalities are seen. IMPRESSION: No acute pulmonary process. Electronically Signed   By: Narda Rutherford M.D.   On: 10/18/2023 18:15   CT Angio Chest Pulmonary Embolism (PE) W or WO Contrast Result Date: 10/18/2023 CLINICAL DATA:  Pulmonary embolism (PE) suspected, low to intermediate prob, positive D-dimer Patient reports shortness of breath on exertion after knee surgery in September. EXAM: CT ANGIOGRAPHY CHEST WITH CONTRAST TECHNIQUE: Multidetector CT imaging of the chest was performed using the standard protocol during bolus administration of intravenous contrast. Multiplanar CT image reconstructions and MIPs were obtained to evaluate the vascular anatomy. RADIATION DOSE REDUCTION: This exam was performed according to the departmental dose-optimization program which includes automated exposure control, adjustment of the mA and/or kV according to patient size and/or use of iterative reconstruction technique. CONTRAST:  OMNIPAQUE IOHEXOL 350 MG/ML SOLN COMPARISON:  Chest CTA 05/09/2023, lung cancer screening CT 04/09/2023. FINDINGS: Cardiovascular: There are no filling defects within the pulmonary arteries to suggest pulmonary embolus. Normal caliber thoracic aorta without acute aortic finding. The heart is upper normal in size. No pericardial effusion. Mediastinum/Nodes: No enlarged mediastinal or hilar lymph nodes.  Mild wall thickening of the distal esophagus. No thyroid nodule. Lungs/Pleura: Clear lungs. No focal airspace disease. No pleural fluid or pneumothorax. No features of pulmonary edema. Trachea and central airways are clear. Stable 4 mm left upper lobe pulmonary nodule series 7, image 51. Some of the additional pulmonary nodules on prior lung cancer screening CT are well-defined. Upper Abdomen: Hepatic steatosis. The stomach is moderately distended with ingested material. Gallstone. Musculoskeletal: There are no acute or suspicious osseous abnormalities. Mild thoracic spondylosis. Review of the MIP images confirms the above findings. IMPRESSION: 1. No pulmonary embolus or acute intrathoracic abnormality. 2. Stable left upper lobe pulmonary nodule. Recommend annual lung cancer screening CT. 3. Mild distal esophageal wall thickening, query reflux. Electronically Signed   By: Narda Rutherford M.D.   On: 10/18/2023 18:14     Assessment & Plan:   No problem-specific Assessment & Plan notes found for this encounter. 1. Shortness of breath (Primary) - Pulmonary Function Test [facility] Only; Future - ECHOCARDIOGRAM COMPLETE; Future     Shortness of breath Hx mild emphysema and former smoker. 25-pound weight gain possibly due to Lyrica, now discontinued. Persistent dyspnea improved with prednisone but recurred after stopping. Exertional dyspnea noted. CTA scan in February was negative for PE or acute chest pathology.  PFT in 2022 showed 84% FEV1, within normal limits.  - Order pulmonary function test at [facility] to assess for lung changes. - Provide sample of Trelegy inhaler to assess response. - Schedule virtual visit in 4 weeks to evaluate response to Trelegy.  Emphysema Mild emphysema confirmed by CT imaging. 2022 PFT showed no obstructive lung disease. Former smoker, quit eight years ago. Considering Trelegy inhaler due to response to prednisone. - Provide sample of Trelegy inhaler to assess  response. - Order pulmonary function test at [facility].  Pulmonary hypertension risk Increased risk for pulmonary hypertension due to sleep apnea. Normal BNP, indicating no fluid overload. Echocardiogram previously ordered but not completed. - Reorder echocardiogram to assess for pulmonary hypertension.  Sleep apnea  Sleep apnea well-controlled with CPAP therapy.Current pressure 13cm h20 with residual AHI 4.1/hour. Consistent CPAP use for approximately 4 hours and 50 minutes per night - Continue CPAP therapy nightly.      Glenford Bayley, NP 11/04/2023

## 2023-11-04 NOTE — Telephone Encounter (Signed)
 Please let patient know that the medication we were talking about approved for weight loss in patients with a diagnosis of obesity and obstructive sleep apnea is called Zepbound, recommend he talk to his primary care about being prescribed medication.  Also please let him know to hold Spiriva while taking Trelegy sample and notify me if inhaler is effective and we can send in prescription

## 2023-11-06 ENCOUNTER — Encounter: Payer: Self-pay | Admitting: Family

## 2023-11-11 NOTE — Progress Notes (Unsigned)
 Referring Physician:  Mort Sawyers, FNP [address] [place] [place],  [place] [address]  Primary Physician:  Mort Sawyers, FNP  History of Present Illness: 11/13/2023 [name] is here today with a chief complaint of neck/shoulder pain radiating down bilateral upper extremities causing weakness and tingling.  Previous history of bilateral carpal and cubital tunnel releases in 2021.  He states that his right upper extremity is worse than the left.  He notices that when he drives for long periods of time that his pain increases and he experiences quite a bit of soreness in his muscles and tingling in his right fourth and fifth digit.  In his left upper extremity it does not radiate past his wrist.  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  He was placed on prednisone which did help quite a bit.  He previously was on Lyrica, but is currently taking Cymbalta and Flexeril for his pain.  Conservative measures:  Physical therapy:  has not participated in PT?  Multimodal medical therapy including regular antiinflammatories:  Celebrex, Cymbalta, Flexeril Injections: no epidural steroid injections  Past Surgery: 2007 lumbar fusion L5  [name] has no symptoms of cervical myelopathy.  The symptoms are causing a significant impact on the patient's life.   Review of Systems:  A 10 point review of systems is negative, except for the pertinent positives and negatives detailed in the HPI.  Past Medical History: Past Medical History:  Diagnosis Date   Sleep apnea     Past Surgical History: Past Surgical History:  Procedure Laterality Date   BILATERAL CARPAL TUNNEL RELEASE Bilateral 2021   COLONOSCOPY WITH PROPOFOL N/A 10/28/2020   TAs Wyline Mood, MD)   JOINT REPLACEMENT Bilateral 2016   REVISION TOTAL KNEE ARTHROPLASTY Left 2020   SPINE SURGERY  2007   Fusion L5    Allergies: Allergies as of 11/13/2023 -  Review Complete 11/13/2023  Allergen Reaction Noted   Penicillins Other (See Comments) 08/05/2020   Oxycodone  04/23/2019    Medications: Outpatient Encounter Medications as of 11/13/2023  Medication Sig   busPIRone (BUSPAR) 5 MG tablet TAKE 1 TABLET BY MOUTH TWICE A DAY   cyclobenzaprine (FLEXERIL) 10 MG tablet Take 1/2 to one tablet at bedtime prn muscle spasm   DULoxetine (CYMBALTA) 30 MG capsule Take 1 capsule (30 mg total) by mouth daily.   Fluticasone-Umeclidin-Vilant (TRELEGY ELLIPTA) 100-62.5-25 MCG/ACT AEPB Inhale 1 puff into the lungs daily.   meloxicam (MOBIC) 7.5 MG tablet Take 2 tablets (15 mg total) by mouth daily.   [DISCONTINUED] Tiotropium Bromide Monohydrate (SPIRIVA RESPIMAT) 2.5 MCG/ACT AERS Inhale 2 puffs into the lungs daily.   No facility-administered encounter medications on file as of 11/13/2023.    Social History: Social History   Tobacco Use   Smoking status: Former    Current packs/day: 0.00    Average packs/day: 1 pack/day for 30.0 years (30.0 ttl pk-yrs)    Types: Cigarettes    Start date: 72    Quit date: 2016    Years since quitting: 9.2   Smokeless tobacco: Never  Vaping Use   Vaping status: Never Used  Substance Use Topics   Alcohol use: Not Currently   Drug use: Never    Family Medical History: Family History  Problem Relation Age of Onset   Asthma Father    Diabetes Father     Physical Examination: @VITALWITHPAIN @  General: Patient is well  developed, well nourished, calm, collected, and in no apparent distress. Attention to examination is appropriate.  Psychiatric: Patient is non-anxious.  Head:  Pupils equal, round, and reactive to light.  ENT:  Oral mucosa appears well hydrated.  Neck:   Supple.  Full range of motion.  Respiratory: Patient is breathing without any difficulty.  Extremities: No edema.  Vascular: Palpable dorsal pedal pulses.  Skin:   On exposed skin, there are no abnormal skin  lesions.  NEUROLOGICAL:     Awake, alert, oriented to person, place, and time.  Speech is clear and fluent. Fund of knowledge is appropriate.   Cranial Nerves: Pupils equal round and reactive to light.  Facial tone is symmetric.    Negative Spurling's.  Some mild tenderness palpation about his right shoulder.  Positive Tinel of his right ulnar nerve.  Strength: Side Biceps Triceps Deltoid Interossei Grip Wrist Ext. Wrist Flex.  R 5 5 5 4  4+ 5 5  L 5 5 5 4  4+ 5 5    Reflexes are 2+ and symmetric at the biceps, triceps, brachioradialis. Hoffman's is absent.  Bilateral upper and lower extremity sensation is intact to light touch, but decreased in his right upper extremity in his fourth and fifth digit compared to the left. Gait is normal.   No difficulty with tandem gait.   No evidence of dysmetria noted.  Medical Decision Making  Imaging: No recent imaging.  I have personally reviewed the images and agree with the above interpretation.  Assessment and Plan: [name] is a pleasant 58 y.o. male  is here today with a chief complaint of neck/shoulder pain radiating down bilateral upper extremities causing weakness and tingling.  Previous history of bilateral carpal and cubital tunnel releases in 2021.  He states that his right upper extremity is worse than the left.  He notices that when he drives for long periods of time that his pain increases and he experiences quite a bit of soreness in his muscles and tingling in his right fourth and fifth digit.  In his left upper extremity it does not radiate past his wrist.  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  He was placed on prednisone which did help quite a bit.  He previously was on Lyrica, but is currently taking Cymbalta and Flexeril for his pain.  On examination, negative Spurling's.  He does have a positive Tinel of his right ulnar nerve.  Some mild weakness to bilateral  intrinsics.  Hoffman's is absent and he has normal reflexive.  Pleasure to see patient in clinic today.  Plan for 4 view x-ray of his cervical spine today for evaluation.  In addition, patient has previously done well with meloxicam and I have sent a refill for this.  Advised him not to take this with ibuprofen, but also encouraged him to take Tylenol more regularly to help with his pain.  I have also placed an order for an EMG of bilateral upper extremities to evaluate for cervical radiculopathy versus recurrent peripheral neuropathies.  Will review once complete.  Patient does not want to undergo physical therapy at this time citing long work hours and his wife's oncologic care.    Thank you for involving me in the care of this patient.   Joan Flores, PA-C Dept. of Neurosurgery

## 2023-11-12 NOTE — Progress Notes (Unsigned)
   Chad Gambill T. Zawadi Aplin, MD, CAQ Sports Medicine [facility] at [facility] [address] [place], [address]  Phone: [phone]  FAX: [phone]  [name] - 57 y.o. male  MRN [id number]  Date of Birth: [birth year]  Date: 11/13/2023  PCP: Chad Sawyers, FNP  Referral: Chad Sawyers, FNP  No chief complaint on file.  Subjective:   [name] is a 58 y.o. very pleasant male patient with There is no height or weight on file to calculate BMI. who presents with the following:  He is a very nice patient who is new to me today presents with some ongoing joint concerns.    Review of Systems is noted in the HPI, as appropriate  Objective:   There were no vitals taken for this visit.  GEN: No acute distress; alert,appropriate. PULM: Breathing comfortably in no respiratory distress PSYCH: Normally interactive.   Laboratory and Imaging Data:  Assessment and Plan:   ***

## 2023-11-13 ENCOUNTER — Ambulatory Visit
Admission: RE | Admit: 2023-11-13 | Discharge: 2023-11-13 | Disposition: A | Discharge status: Home / Self Care | Payer: BC Managed Care – PPO | Source: Ambulatory Visit | Attending: Family

## 2023-11-13 ENCOUNTER — Ambulatory Visit
Admission: RE | Admit: 2023-11-13 | Discharge: 2023-11-13 | Disposition: A | Discharge status: Home / Self Care | Source: Ambulatory Visit | Attending: Physician Assistant | Admitting: Physician Assistant

## 2023-11-13 ENCOUNTER — Ambulatory Visit: Payer: BC Managed Care – PPO | Admitting: Physician Assistant

## 2023-11-13 ENCOUNTER — Ambulatory Visit
Admission: RE | Admit: 2023-11-13 | Discharge: 2023-11-13 | Disposition: A | Discharge status: Home / Self Care | Source: Home / Self Care | Attending: Physician Assistant | Admitting: Physician Assistant

## 2023-11-13 ENCOUNTER — Ambulatory Visit: Payer: BC Managed Care – PPO | Admitting: Family Medicine

## 2023-11-13 ENCOUNTER — Ambulatory Visit
Admission: RE | Admit: 2023-11-13 | Discharge: 2023-11-13 | Disposition: A | Discharge status: Home / Self Care | Payer: BC Managed Care – PPO | Source: Ambulatory Visit | Attending: Family | Admitting: Family

## 2023-11-13 ENCOUNTER — Encounter: Payer: Self-pay | Admitting: Physician Assistant

## 2023-11-13 VITALS — BP 132/88 | Ht 71.0 in | Wt 305.0 lb

## 2023-11-13 VITALS — BP 110/72 | HR 74 | Temp 97.9°F | Ht 70.0 in | Wt 307.2 lb

## 2023-11-13 DIAGNOSIS — G8929 Other chronic pain: Secondary | ICD-10-CM

## 2023-11-13 DIAGNOSIS — R590 Localized enlarged lymph nodes: Secondary | ICD-10-CM | POA: Insufficient documentation

## 2023-11-13 DIAGNOSIS — R222 Localized swelling, mass and lump, trunk: Secondary | ICD-10-CM | POA: Insufficient documentation

## 2023-11-13 DIAGNOSIS — M2550 Pain in unspecified joint: Secondary | ICD-10-CM

## 2023-11-13 DIAGNOSIS — R200 Anesthesia of skin: Secondary | ICD-10-CM

## 2023-11-13 DIAGNOSIS — M542 Cervicalgia: Secondary | ICD-10-CM | POA: Insufficient documentation

## 2023-11-13 DIAGNOSIS — M19049 Primary osteoarthritis, unspecified hand: Secondary | ICD-10-CM | POA: Diagnosis not present

## 2023-11-13 DIAGNOSIS — R2232 Localized swelling, mass and lump, left upper limb: Secondary | ICD-10-CM | POA: Diagnosis not present

## 2023-11-13 DIAGNOSIS — Z0389 Encounter for observation for other suspected diseases and conditions ruled out: Secondary | ICD-10-CM | POA: Diagnosis not present

## 2023-11-13 DIAGNOSIS — M47812 Spondylosis without myelopathy or radiculopathy, cervical region: Secondary | ICD-10-CM | POA: Diagnosis not present

## 2023-11-13 DIAGNOSIS — Z9889 Other specified postprocedural states: Secondary | ICD-10-CM

## 2023-11-13 DIAGNOSIS — M4802 Spinal stenosis, cervical region: Secondary | ICD-10-CM | POA: Diagnosis not present

## 2023-11-13 DIAGNOSIS — R202 Paresthesia of skin: Secondary | ICD-10-CM

## 2023-11-13 DIAGNOSIS — M25511 Pain in right shoulder: Secondary | ICD-10-CM

## 2023-11-13 DIAGNOSIS — M25512 Pain in left shoulder: Secondary | ICD-10-CM

## 2023-11-13 DIAGNOSIS — M5030 Other cervical disc degeneration, unspecified cervical region: Secondary | ICD-10-CM | POA: Diagnosis not present

## 2023-11-13 MED ORDER — MELOXICAM 7.5 MG PO TABS: 15.0000 mg | ORAL_TABLET | Freq: Every day | ORAL | 2 refills | Status: DC

## 2023-11-14 ENCOUNTER — Encounter: Payer: Self-pay | Admitting: Family Medicine

## 2023-11-14 LAB — HIGH SENSITIVITY CRP: CRP, High Sensitivity: 7.84 mg/L — ABNORMAL HIGH (ref 0.000–5.000)

## 2023-11-14 LAB — SEDIMENTATION RATE: Sed Rate: 12 mm/h (ref 0–20)

## 2023-11-15 ENCOUNTER — Other Ambulatory Visit: Payer: Self-pay | Admitting: Family

## 2023-11-15 DIAGNOSIS — M2550 Pain in unspecified joint: Secondary | ICD-10-CM

## 2023-11-15 DIAGNOSIS — F419 Anxiety disorder, unspecified: Secondary | ICD-10-CM

## 2023-11-15 DIAGNOSIS — M5412 Radiculopathy, cervical region: Secondary | ICD-10-CM

## 2023-11-18 ENCOUNTER — Encounter: Payer: Self-pay | Admitting: Neurology

## 2023-11-19 ENCOUNTER — Other Ambulatory Visit: Payer: Self-pay | Admitting: Family

## 2023-11-19 ENCOUNTER — Other Ambulatory Visit: Payer: Self-pay

## 2023-11-19 DIAGNOSIS — M25511 Pain in right shoulder: Secondary | ICD-10-CM

## 2023-11-19 DIAGNOSIS — R202 Paresthesia of skin: Secondary | ICD-10-CM

## 2023-11-19 DIAGNOSIS — M62838 Other muscle spasm: Secondary | ICD-10-CM

## 2023-11-20 MED ORDER — CYCLOBENZAPRINE HCL 10 MG PO TABS: ORAL_TABLET | ORAL | 0 refills | Status: DC

## 2023-11-21 LAB — ANALYZER(R)ANA IFA WITH REFLEX TITER/PATTRN,SYS AUTOIMM PNL1
Anti Nuclear Antibody (ANA): NEGATIVE
Anticardiolipin IgA: <2 [APL'U]/mL
Anticardiolipin IgG: <2 [GPL'U]/mL
Anticardiolipin IgM: <2 [MPL'U]/mL
Beta-2 Glyco 1 IgA: <2 U/mL
Beta-2 Glyco 1 IgM: <2 U/mL
Beta-2 Glyco I IgG: <2 U/mL
C3 Complement: 166 mg/dL (ref 82–185)
C4 Complement: 21 mg/dL (ref 15–53)
Centromere Ab Screen: <1 AI
Chromatin (Nucleosomal) Antibody: <1 AI
Cyclic Citrullin Peptide Ab: <16 U
DNA Ab (DS) Crithidia, IFA: NEGATIVE
ENA SM Ab Ser-aCnc: <1 AI
Jo-1 Autoabs: <1 AI
MUTATED CITRULLINATED VIMENTIN (MCV) AB: <20 U/mL (ref <20)
Rheumatoid Factor (IgA): <5 U
Rheumatoid Factor (IgG): 6 U
Rheumatoid Factor (IgM): <5 U
Ribonucleic Protein(ENA) Antibody, IgG: <1 AI
SM/RNP: <1 AI
SSA (Ro) (ENA) Antibody, IgG: <1 AI
SSB (La) (ENA) Antibody, IgG: <1 AI
Scleroderma (Scl-70) (ENA) Antibody, IgG: <1 AI
Thyroperoxidase Ab SerPl-aCnc: <1 [IU]/mL (ref <9)

## 2023-11-23 ENCOUNTER — Encounter: Payer: Self-pay | Admitting: Family Medicine

## 2023-11-25 ENCOUNTER — Encounter: Payer: Self-pay | Admitting: Family

## 2023-12-12 ENCOUNTER — Encounter: Payer: Self-pay | Admitting: Nurse Practitioner

## 2023-12-12 ENCOUNTER — Ambulatory Visit: Admitting: Neurology

## 2023-12-12 ENCOUNTER — Telehealth: Admitting: Nurse Practitioner

## 2023-12-12 VITALS — Ht 71.0 in | Wt 300.0 lb

## 2023-12-12 DIAGNOSIS — R911 Solitary pulmonary nodule: Secondary | ICD-10-CM | POA: Diagnosis not present

## 2023-12-12 DIAGNOSIS — R202 Paresthesia of skin: Secondary | ICD-10-CM | POA: Diagnosis not present

## 2023-12-12 DIAGNOSIS — G4733 Obstructive sleep apnea (adult) (pediatric): Secondary | ICD-10-CM

## 2023-12-12 DIAGNOSIS — J439 Emphysema, unspecified: Secondary | ICD-10-CM | POA: Diagnosis not present

## 2023-12-12 DIAGNOSIS — G5603 Carpal tunnel syndrome, bilateral upper limbs: Secondary | ICD-10-CM

## 2023-12-12 MED ORDER — TRELEGY ELLIPTA 100-62.5-25 MCG/ACT IN AEPB: 1.0000 | INHALATION_SPRAY | Freq: Every day | RESPIRATORY_TRACT | 11 refills | Status: DC

## 2023-12-12 NOTE — Patient Instructions (Addendum)
 Continue Trelegy 1 puff daily. Brush tongue and rinse mouth afterwards Continue Albuterol inhaler 2 puffs every 6 hours as needed for shortness of breath or wheezing. Notify if symptoms persist despite rescue inhaler/neb use.   Continue to use CPAP every night, minimum of 4-6 hours a night.  Change equipment as directed. Wash your tubing with warm soap and water daily, hang to dry. Wash humidifier portion weekly. Use bottled, distilled water and change daily Be aware of reduced alertness and do not drive or operate heavy machinery if experiencing this or drowsiness.  Exercise encouraged, as tolerated. Healthy weight management discussed.  Avoid or decrease alcohol consumption and medications that make you more sleepy, if possible. Notify if persistent daytime sleepiness occurs even with consistent use of PAP therapy.  Glad you are doing better with the Trelegy!   I will follow up on the lung function testing order and the echocardiogram. Someone should call you to schedule these. If you haven't heard something in the next 1-2 weeks, let us know  Follow up with any new MD in [facility] for emphysema in 4 months. Follow up with any NP for sleep follow up in 1 year. If symptoms do not improve or worsen, please contact office for sooner follow up or seek emergency care.

## 2023-12-12 NOTE — Assessment & Plan Note (Signed)
 Excellent compliance and control. Receives benefit from use. Aware of risks of untreated OSA. Safe driving practices reviewed.

## 2023-12-12 NOTE — Progress Notes (Signed)
 Patient ID: [name], male     DOB: [birth year], 58 y.o.      MRN: [id number]  Chief Complaint  Patient presents with   Follow-up    Refill trellegy med. Pt states inhaler is helping.    Virtual Visit via Video Note  I connected with [name] on 12/12/23 at  1:30 PM EDT by a video enabled telemedicine application and verified that I am speaking with the correct person using two identifiers.  Location: Patient: Home Provider: Office   I discussed the limitations of evaluation and management by telemedicine and the availability of in person appointments. The patient expressed understanding and agreed to proceed.  History of Present Illness: 58 year old male, former smoker followed for emphysema, lung nodules and OSA on CPAP. He is a patient of Dr.  And last seen in office 11/04/2023 by Clent Ridges, NP. Past medical history significant for cervical radiculopathy, LAD, anxiety, obesity.  TESTS/EVENTS: 01/11/2021 PFT: FVC 81, FEV1 86, ratio 82, TLC 89, DLCO 94 10/18/2023 CTA chest: no PE. Clear lungs. Trachea and central airways are clear. Stable 4 mm LUL nodule. Other additional nodules not well defined. Hepatic steatosis. Mild esophageal wall thickening.   11/04/2023: Sudie Grumbling with Clent Ridges NP. Followed for emphysema, scattered nodules. Had CTA in February due to elevated d dimer; negative for PE or acute process. Continue annual LDCT chest. Has been having persistent SOB. Improved with prednisone but since returned. Daily cough and some wheezing. Prednisone also helped with this. Works as a [employer]. Exposed to dust and fumes. Father had asthma. Has had weight gain of 25 lb, which he related to lyrica use (since discontinued). Uses CPAP nightly.  Repeat PFT. Prior in 2022 without formal obstruction. Provide with sample of Trelegy. Reorder echo to rule out PH.   12/12/2023: Today - follow up Discussed the use of AI scribe software for clinical note transcription with the patient, who gave verbal  consent to proceed.  History of Present Illness   [name] is a 58 year old male who presents for follow up. He never had his PFT or his echo.   He was experiencing breathing difficulties and switched to Trelegy, which he takes first thing in the morning. The medication is helping him breathe deeper and improving his activity stamina, allowing him to walk. Feels like he is doing much better on this. He says he thinks it's one of the only things that has worked for him. He does need a refill.   He has a cough, which he attributes to sinus issues, likely exacerbated by current pollen levels. Cough is not bothersome. Not usually productive. No chest congestion, wheezing, fevers, chills, hemoptysis.   He wears his CPAP nightly. Sleeps well with it. Feels well rested. No drowsy driving.  12/10/8117-09/07/7827: CPAP 13 cmH2O 30/30 days; 93% >4 hr; average use 6 hr 12 min Leaks 6.7 AHI 2.9      Allergies  Allergen Reactions   Penicillins Other (See Comments)   Oxycodone    Immunization History  Administered Date(s) Administered   Hepatitis B, ADULT 02/05/2017, 03/13/2017, 08/07/2017   Influenza,inj,Quad PF,6+ Mos 09/05/2020   Past Medical History:  Diagnosis Date   Sleep apnea     Tobacco History: Social History   Tobacco Use  Smoking Status Former   Current packs/day: 0.00   Average packs/day: 1 pack/day for 30.0 years (30.0 ttl pk-yrs)   Types: Cigarettes   Start date: 44   Quit date: 2016  Years since quitting: 9.2  Smokeless Tobacco Never   Counseling given: Not Answered   Outpatient Medications Prior to Visit  Medication Sig Dispense Refill   busPIRone (BUSPAR) 5 MG tablet TAKE 1 TABLET BY MOUTH TWICE A DAY 180 tablet 2   cyclobenzaprine (FLEXERIL) 10 MG tablet Take 1/2 to one tablet at bedtime prn muscle spasm 30 tablet 0   DULoxetine (CYMBALTA) 30 MG capsule TAKE 1 CAPSULE BY MOUTH EVERY DAY 90 capsule 1   meloxicam (MOBIC) 7.5 MG tablet Take 2 tablets (15  mg total) by mouth daily. 60 tablet 2   Fluticasone-Umeclidin-Vilant (TRELEGY ELLIPTA) 100-62.5-25 MCG/ACT AEPB Inhale 1 puff into the lungs daily.     No facility-administered medications prior to visit.     Review of Systems:   Constitutional: No weight loss or gain, night sweats, fevers, chills, fatigue, or lassitude. HEENT: No headaches, difficulty swallowing, tooth/dental problems, or sore throat. No sneezing, itching, ear ache +nasal congestion, post nasal drip CV:  No chest pain, orthopnea, PND, swelling in lower extremities, anasarca, dizziness, palpitations, syncope Resp: +improved shortness of breath with exertion; minimal cough. No excess mucus or change in color of mucus. No hemoptysis. No wheezing.  No chest wall deformity GI:  No heartburn, indigestion Skin: No rash, lesions, ulcerations Neuro: No dizziness or lightheadedness.  Psych: No depression or anxiety. Mood stable.   Observations/Objective: Patient is well-developed, well-nourished in no acute distress.  Resting comfortably at home.  No labored breathing.  Speech is clear and coherent with logical content.  Patient is alert and oriented at baseline.   Assessment and Plan: Emphysema lung (HCC) No formal obstruction on prior PFT. Emphysematous changes on imaging. Clinically improved with step up to Trelegy from [facility]. Will refill this. He still needs repeat PFTs - will follow up regarding scheduling of these. Action plan in place. Encouraged to work on graded exercises. Trigger prevention.  Patient Instructions  Continue Trelegy 1 puff daily. Brush tongue and rinse mouth afterwards Continue Albuterol inhaler 2 puffs every 6 hours as needed for shortness of breath or wheezing. Notify if symptoms persist despite rescue inhaler/neb use.   Continue to use CPAP every night, minimum of 4-6 hours a night.  Change equipment as directed. Wash your tubing with warm soap and water daily, hang to dry. Wash humidifier  portion weekly. Use bottled, distilled water and change daily Be aware of reduced alertness and do not drive or operate heavy machinery if experiencing this or drowsiness.  Exercise encouraged, as tolerated. Healthy weight management discussed.  Avoid or decrease alcohol consumption and medications that make you more sleepy, if possible. Notify if persistent daytime sleepiness occurs even with consistent use of PAP therapy.  Glad you are doing better with the Trelegy!   I will follow up on the lung function testing order and the echocardiogram. Someone should call you to schedule these. If you haven't heard something in the next 1-2 weeks, let us know  Follow up with any new MD in [facility] for emphysema in 4 months. Follow up with any NP for sleep follow up in 1 year. If symptoms do not improve or worsen, please contact office for sooner follow up or seek emergency care.    Obstructive sleep apnea syndrome Excellent compliance and control. Receives benefit from use. Aware of risks of untreated OSA. Safe driving practices reviewed.   Lung nodule Stable on prior imaging. Follow up with lung cancer screening program as scheduled.      I discussed the  assessment and treatment plan with the patient. The patient was provided an opportunity to ask questions and all were answered. The patient agreed with the plan and demonstrated an understanding of the instructions.   The patient was advised to call back or seek an in-person evaluation if the symptoms worsen or if the condition fails to improve as anticipated.  I provided 22 minutes of non-face-to-face time during this encounter.   Noemi Chapel, NP

## 2023-12-12 NOTE — Procedures (Signed)
 [facility]  [address], [address]  [place], [place] [address] Tel: [phone] Fax: [phone] Test Date:  12/12/2023  Patient: [name] DOB: [birth year] Physician: Nita Sickle, DO  Sex: Male Height: 5\' 10"  Ref Phys: Joan Flores, PA-C  ID#: [id number]   Technician:    History: This is a 58 year-old man referred with history of bilateral CTS release and ulnar nerve decompression referred for evaluation of bilateral hand paresthesias.   NCV & EMG Findings: Extensive electrodiagnostic testing of the right upper extremity and additional studies of the left shows:  Bilateral median and right ulnar sensory responses show prolonged latency (R3.9, L3.9, R3.2 ms) and reduced amplitude (R12.0, L14.4, R7.4 V).  Left ulnar sensory response shows prolonged latency (3.7 ms).   Bilateral median and ulnar sensory responses are within normal limits.   Chronic motor axonal loss changes are isolated to bilateral abductor pollicis brevis muscles, without accompanying active denervation.    Impression: Bilateral median neuropathy at or distal to the wrist, consistent with a clinical diagnosis of carpal tunnel syndrome.  Overall, these findings are moderate in degree electrically. Abnormalities involving bilateral ulnar sensory responses are most likely due to the residuals of previously treated ulnar neuropathy.     ___________________________ Nita Sickle, DO    Nerve Conduction Studies   Stim Site NR Peak (ms) Norm Peak (ms) O-P Amp (V) Norm O-P Amp  Left Median Anti Sensory (2nd Digit)  32 C  Wrist    *3.9 <3.6 *14.4 >15  Right Median Anti Sensory (2nd Digit)  32 C  Wrist    *3.9 <3.6 *12.0 >15  Left Ulnar Anti Sensory (5th Digit)  32 C  Wrist    *3.7 <3.1 11.4 >10  Right Ulnar Anti Sensory (5th Digit)  32 C  Wrist    *3.2 <3.1 *7.4 >10     Stim Site NR Onset (ms) Norm Onset (ms) O-P Amp (mV) Norm O-P Amp Site1 Site2 Delta-0 (ms) Dist (cm) Vel (m/s) Norm Vel  (m/s)  Left Median Motor (Abd Poll Brev)  32 C  Wrist    4.0 <4.0 7.4 >6 Elbow Wrist 5.9 33.0 56 >50  Elbow    9.9  6.4         Right Median Motor (Abd Poll Brev)  32 C  Wrist    3.8 <4.0 8.5 >6 Elbow Wrist 6.0 32.0 53 >50  Elbow    9.8  8.4         Left Ulnar Motor (Abd Dig Minimi)  32 C  Wrist    3.1 <3.1 11.8 >7 B Elbow Wrist 4.3 26.0 60 >50  B Elbow    7.4  10.4  A Elbow B Elbow 1.8 10.0 56 >50  A Elbow    9.2  10.0         Right Ulnar Motor (Abd Dig Minimi)  32 C  Wrist    2.3 <3.1 11.4 >7 B Elbow Wrist 4.9 29.0 59 >50  B Elbow    7.2  10.8  A Elbow B Elbow 1.5 10.0 67 >50  A Elbow    8.7  10.8          Electromyography   Side Muscle Ins.Act Fibs Fasc Recrt Amp Dur Poly Activation Comment  Right 1stDorInt Nml Nml Nml Nml Nml Nml Nml Nml N/A  Right Abd Poll Brev Nml Nml Nml *1- *1+ *1+ *1+ Nml N/A  Right PronatorTeres Nml Nml Nml Nml Nml Nml Nml Nml  N/A  Right Biceps Nml Nml Nml Nml Nml Nml Nml Nml N/A  Right Triceps Nml Nml Nml Nml Nml Nml Nml Nml N/A  Right Deltoid Nml Nml Nml Nml Nml Nml Nml Nml N/A  Left 1stDorInt Nml Nml Nml Nml Nml Nml Nml Nml N/A  Left Abd Poll Brev Nml Nml Nml *1- *1+ *1+ *1+ Nml N/A  Left PronatorTeres Nml Nml Nml Nml Nml Nml Nml Nml N/A  Left Biceps Nml Nml Nml Nml Nml Nml Nml Nml N/A  Left Triceps Nml Nml Nml Nml Nml Nml Nml Nml N/A  Left Deltoid Nml Nml Nml Nml Nml Nml Nml Nml N/A      Waveforms:

## 2023-12-12 NOTE — Assessment & Plan Note (Signed)
 No formal obstruction on prior PFT. Emphysematous changes on imaging. Clinically improved with step up to Trelegy from [facility]. Will refill this. He still needs repeat PFTs - will follow up regarding scheduling of these. Action plan in place. Encouraged to work on graded exercises. Trigger prevention.  Patient Instructions  Continue Trelegy 1 puff daily. Brush tongue and rinse mouth afterwards Continue Albuterol inhaler 2 puffs every 6 hours as needed for shortness of breath or wheezing. Notify if symptoms persist despite rescue inhaler/neb use.   Continue to use CPAP every night, minimum of 4-6 hours a night.  Change equipment as directed. Wash your tubing with warm soap and water daily, hang to dry. Wash humidifier portion weekly. Use bottled, distilled water and change daily Be aware of reduced alertness and do not drive or operate heavy machinery if experiencing this or drowsiness.  Exercise encouraged, as tolerated. Healthy weight management discussed.  Avoid or decrease alcohol consumption and medications that make you more sleepy, if possible. Notify if persistent daytime sleepiness occurs even with consistent use of PAP therapy.  Glad you are doing better with the Trelegy!   I will follow up on the lung function testing order and the echocardiogram. Someone should call you to schedule these. If you haven't heard something in the next 1-2 weeks, let us know  Follow up with any new MD in [facility] for emphysema in 4 months. Follow up with any NP for sleep follow up in 1 year. If symptoms do not improve or worsen, please contact office for sooner follow up or seek emergency care.

## 2023-12-12 NOTE — Assessment & Plan Note (Signed)
 Stable on prior imaging. Follow up with lung cancer screening program as scheduled.

## 2023-12-24 ENCOUNTER — Other Ambulatory Visit: Payer: Self-pay | Admitting: Primary Care

## 2023-12-24 ENCOUNTER — Other Ambulatory Visit: Payer: Self-pay | Admitting: Family

## 2023-12-24 DIAGNOSIS — M25511 Pain in right shoulder: Secondary | ICD-10-CM

## 2023-12-24 DIAGNOSIS — M62838 Other muscle spasm: Secondary | ICD-10-CM

## 2023-12-24 MED ORDER — CYCLOBENZAPRINE HCL 10 MG PO TABS: ORAL_TABLET | ORAL | 0 refills | Status: DC

## 2024-01-09 ENCOUNTER — Encounter: Admitting: Neurology

## 2024-01-20 ENCOUNTER — Other Ambulatory Visit: Payer: Self-pay | Admitting: Family

## 2024-01-20 DIAGNOSIS — M62838 Other muscle spasm: Secondary | ICD-10-CM

## 2024-01-20 DIAGNOSIS — M25511 Pain in right shoulder: Secondary | ICD-10-CM

## 2024-02-01 ENCOUNTER — Telehealth: Payer: Self-pay | Admitting: Physician Assistant

## 2024-02-01 DIAGNOSIS — R200 Anesthesia of skin: Secondary | ICD-10-CM

## 2024-02-01 DIAGNOSIS — M542 Cervicalgia: Secondary | ICD-10-CM

## 2024-02-02 NOTE — Telephone Encounter (Signed)
 Refill request for mobic . Would be out on 6/12. Refill sent for a month supply.   Please call to let him know and also scheudle him a follow up with Ruthann Cover.

## 2024-02-03 ENCOUNTER — Ambulatory Visit: Admitting: Internal Medicine

## 2024-02-03 ENCOUNTER — Ambulatory Visit
Admission: RE | Admit: 2024-02-03 | Discharge: 2024-02-03 | Disposition: A | Discharge status: Home / Self Care | Source: Ambulatory Visit | Attending: Primary Care | Admitting: Primary Care

## 2024-02-03 ENCOUNTER — Other Ambulatory Visit: Payer: Self-pay

## 2024-02-03 DIAGNOSIS — R0602 Shortness of breath: Secondary | ICD-10-CM

## 2024-02-03 DIAGNOSIS — I517 Cardiomegaly: Secondary | ICD-10-CM | POA: Diagnosis not present

## 2024-02-03 DIAGNOSIS — R0600 Dyspnea, unspecified: Secondary | ICD-10-CM | POA: Diagnosis not present

## 2024-02-03 LAB — PULMONARY FUNCTION TEST
DL/VA % pred: 137 %
DL/VA: 5.9 ml/min/mmHg/L
DLCO unc % pred: 120 %
DLCO unc: 35.11 ml/min/mmHg
FEF 25-75 Post: 2.98 L/s
FEF 25-75 Pre: 3.28 L/s
FEF2575-%Change-Post: -9 %
FEF2575-%Pred-Post: 89 %
FEF2575-%Pred-Pre: 98 %
FEV1-%Change-Post: -2 %
FEV1-%Pred-Post: 80 %
FEV1-%Pred-Pre: 81 %
FEV1-Post: 3.13 L
FEV1-Pre: 3.2 L
FEV1FVC-%Change-Post: 0 %
FEV1FVC-%Pred-Pre: 104 %
FEV6-%Change-Post: -2 %
FEV6-%Pred-Post: 78 %
FEV6-%Pred-Pre: 80 %
FEV6-Post: 3.83 L
FEV6-Pre: 3.93 L
FEV6FVC-%Change-Post: 0 %
FEV6FVC-%Pred-Post: 103 %
FEV6FVC-%Pred-Pre: 102 %
FVC-%Change-Post: -2 %
FVC-%Pred-Post: 75 %
FVC-%Pred-Pre: 78 %
FVC-Post: 3.86 L
FVC-Pre: 3.98 L
Post FEV1/FVC ratio: 81 %
Post FEV6/FVC ratio: 99 %
Pre FEV1/FVC ratio: 80 %
Pre FEV6/FVC Ratio: 99 %
RV % pred: 89 %
RV: 1.95 L
TLC % pred: 83 %
TLC: 6 L

## 2024-02-03 LAB — ECHOCARDIOGRAM COMPLETE
AR max vel: 2.71 cm2
AV Area VTI: 2.74 cm2
AV Area mean vel: 2.58 cm2
AV Mean grad: 4.5 mmHg
AV Peak grad: 7 mmHg
Ao pk vel: 1.32 m/s
Area-P 1/2: 2.86 cm2
Height: 71 in
MV VTI: 3.21 cm2
S' Lateral: 2.8 cm
Weight: 4982.4 [oz_av]

## 2024-02-03 NOTE — Progress Notes (Signed)
*  PRELIMINARY RESULTS* Echocardiogram 2D Echocardiogram has been performed.  [name] 02/03/2024, 11:33 AM

## 2024-02-03 NOTE — Progress Notes (Signed)
 Full PFT completed today ? ?

## 2024-02-03 NOTE — Patient Instructions (Signed)
 Full PFT completed today ? ?

## 2024-02-03 NOTE — Progress Notes (Signed)
*  PRELIMINARY RESULTS* Echocardiogram 2D Echocardiogram has been performed.  [name] 02/03/2024, 11:32 AM

## 2024-02-04 ENCOUNTER — Ambulatory Visit: Payer: Self-pay | Admitting: Nurse Practitioner

## 2024-02-04 NOTE — Progress Notes (Signed)
 Normal lung function testing. Ensure he has f/u with new pulmonologist in [place] sometime in August. Thanks.

## 2024-02-05 ENCOUNTER — Other Ambulatory Visit: Payer: Self-pay | Admitting: Nurse Practitioner

## 2024-02-05 DIAGNOSIS — J439 Emphysema, unspecified: Secondary | ICD-10-CM

## 2024-02-18 ENCOUNTER — Other Ambulatory Visit: Payer: Self-pay | Admitting: Family

## 2024-02-18 DIAGNOSIS — F419 Anxiety disorder, unspecified: Secondary | ICD-10-CM

## 2024-02-18 DIAGNOSIS — M5412 Radiculopathy, cervical region: Secondary | ICD-10-CM

## 2024-02-18 DIAGNOSIS — M2550 Pain in unspecified joint: Secondary | ICD-10-CM

## 2024-02-29 ENCOUNTER — Other Ambulatory Visit: Payer: Self-pay | Admitting: Orthopedic Surgery

## 2024-02-29 DIAGNOSIS — R200 Anesthesia of skin: Secondary | ICD-10-CM

## 2024-02-29 DIAGNOSIS — M542 Cervicalgia: Secondary | ICD-10-CM

## 2024-03-28 ENCOUNTER — Other Ambulatory Visit: Payer: Self-pay | Admitting: Neurosurgery

## 2024-03-28 DIAGNOSIS — R202 Paresthesia of skin: Secondary | ICD-10-CM

## 2024-03-28 DIAGNOSIS — M542 Cervicalgia: Secondary | ICD-10-CM

## 2024-03-30 ENCOUNTER — Encounter: Payer: Self-pay | Admitting: *Deleted

## 2024-03-30 ENCOUNTER — Other Ambulatory Visit: Payer: Self-pay | Admitting: *Deleted

## 2024-03-30 DIAGNOSIS — Z87891 Personal history of nicotine dependence: Secondary | ICD-10-CM

## 2024-03-30 DIAGNOSIS — Z122 Encounter for screening for malignant neoplasm of respiratory organs: Secondary | ICD-10-CM

## 2024-04-13 ENCOUNTER — Ambulatory Visit: Admitting: Nurse Practitioner

## 2024-04-20 ENCOUNTER — Other Ambulatory Visit
Admission: RE | Admit: 2024-04-20 | Discharge: 2024-04-20 | Disposition: A | Discharge status: Home / Self Care | Source: Ambulatory Visit | Attending: Pulmonary Disease | Admitting: Pulmonary Disease

## 2024-04-20 ENCOUNTER — Encounter: Payer: Self-pay | Admitting: Pulmonary Disease

## 2024-04-20 ENCOUNTER — Ambulatory Visit: Admitting: Pulmonary Disease

## 2024-04-20 ENCOUNTER — Ambulatory Visit
Admission: RE | Admit: 2024-04-20 | Discharge: 2024-04-20 | Disposition: A | Discharge status: Home / Self Care | Source: Ambulatory Visit | Attending: Acute Care | Admitting: Acute Care

## 2024-04-20 VITALS — BP 150/108 | HR 78 | Temp 98.1°F | Wt 291.6 lb

## 2024-04-20 DIAGNOSIS — G4733 Obstructive sleep apnea (adult) (pediatric): Secondary | ICD-10-CM

## 2024-04-20 DIAGNOSIS — Z122 Encounter for screening for malignant neoplasm of respiratory organs: Secondary | ICD-10-CM | POA: Diagnosis not present

## 2024-04-20 DIAGNOSIS — J45909 Unspecified asthma, uncomplicated: Secondary | ICD-10-CM | POA: Diagnosis not present

## 2024-04-20 DIAGNOSIS — K8020 Calculus of gallbladder without cholecystitis without obstruction: Secondary | ICD-10-CM | POA: Diagnosis not present

## 2024-04-20 DIAGNOSIS — R0602 Shortness of breath: Secondary | ICD-10-CM

## 2024-04-20 DIAGNOSIS — D721 Eosinophilia, unspecified: Secondary | ICD-10-CM | POA: Insufficient documentation

## 2024-04-20 DIAGNOSIS — Z87891 Personal history of nicotine dependence: Secondary | ICD-10-CM | POA: Insufficient documentation

## 2024-04-20 DIAGNOSIS — J439 Emphysema, unspecified: Secondary | ICD-10-CM | POA: Diagnosis not present

## 2024-04-20 LAB — CBC WITH DIFFERENTIAL/PLATELET
Abs Immature Granulocytes: 0.03 K/uL (ref 0.00–0.07)
Basophils Absolute: 0.1 K/uL (ref 0.0–0.1)
Basophils Relative: 1 %
Eosinophils Absolute: 0.5 K/uL (ref 0.0–0.5)
Eosinophils Relative: 6 %
HCT: 42.3 % (ref 39.0–52.0)
Hemoglobin: 15.2 g/dL (ref 13.0–17.0)
Immature Granulocytes: 0 %
Lymphocytes Relative: 33 %
Lymphs Abs: 2.8 K/uL (ref 0.7–4.0)
MCH: 31 pg (ref 26.0–34.0)
MCHC: 35.9 g/dL (ref 30.0–36.0)
MCV: 86.2 fL (ref 80.0–100.0)
Monocytes Absolute: 0.5 K/uL (ref 0.1–1.0)
Monocytes Relative: 6 %
Neutro Abs: 4.4 K/uL (ref 1.7–7.7)
Neutrophils Relative %: 54 %
Platelets: 192 K/uL (ref 150–400)
RBC: 4.91 MIL/uL (ref 4.22–5.81)
RDW: 13.8 % (ref 11.5–15.5)
WBC: 8.3 K/uL (ref 4.0–10.5)
nRBC: 0 % (ref 0.0–0.2)

## 2024-04-20 LAB — NITRIC OXIDE: Nitric Oxide: 24

## 2024-04-20 MED ORDER — TRELEGY ELLIPTA 100-62.5-25 MCG/ACT IN AEPB
1.0000 | INHALATION_SPRAY | Freq: Every day | RESPIRATORY_TRACT | 11 refills | Status: AC
Start: 2024-04-20 — End: ?

## 2024-04-20 MED ORDER — ALBUTEROL SULFATE HFA 108 (90 BASE) MCG/ACT IN AERS: 2.0000 | INHALATION_SPRAY | Freq: Four times a day (QID) | RESPIRATORY_TRACT | 3 refills | Status: AC | PRN

## 2024-04-20 NOTE — Patient Instructions (Signed)
 VISIT SUMMARY:  Today, you came in for a follow-up appointment to discuss your severe sleep apnea and other respiratory issues. We reviewed your current treatment and discussed some new symptoms and concerns you have been experiencing.  YOUR PLAN:  -ASTHMA AND EMPHYSEMA (ASTHMA-COPD OVERLAP): You have a combination of asthma and emphysema, which are conditions that cause difficulty breathing. We will order a FENO test to check for airway inflammation, prescribe Trelegy to help manage your symptoms, and provide you with an emergency inhaler for immediate relief when needed.  -OBSTRUCTIVE SLEEP APNEA: Obstructive sleep apnea is a condition where your breathing stops and starts during sleep. You are managing this with a CPAP machine and are doing well, although you are having trouble accessing your data due to password issues. We will not make any changes to your CPAP treatment at this time.  -ALLERGIC RHINITIS (UNDER EVALUATION): Allergic rhinitis is an allergic reaction that causes sneezing, congestion, and a runny nose. We will order blood work to check for allergies and determine if this is contributing to your symptoms.  INSTRUCTIONS:  Please complete the FENO test and blood work as ordered. Your prescriptions for Trelegy and an emergency inhaler will be sent via mail order. If you have any issues with your CPAP machine or need further assistance, please contact our office.

## 2024-04-20 NOTE — Progress Notes (Signed)
 Subjective:    Patient ID: [name], male    DOB: August 23, 1966, 58 y.o.   MRN: [id number]  Patient Care Team: Corwin Antu, FNP as PCP - General [facility])  Chief Complaint  Patient presents with   Consult    Needs refill for Trelegy. Has been off x 2 months. Occasional cough, shortness of breath and wheezing.     BACKGROUND:58 year old male, former smoker quit in 2016 (30-pack-year history). Past medical history significant for emphysema, obstructive sleep apnea. Seen for initial sleep consult by Redell Prescott, NP on 09/05/2020. Sleep study in 2017 showed severe obstructive sleep apnea with average AHI 51/hr (supine position AHI 128). Referred to lung cancer screening program ordered for PFTs.  Patient was previously followed by Dr. Carolynne Allan.  Dr. Allan was following him for severe obstructive sleep apnea and COPD.  This is his first visit with me.  Previously seen by nurse practitioners.  Last visit was 04 November 2023 with Almarie Ferrari, NP.  HPI Discussed the use of AI scribe software for clinical note transcription with the patient, who gave verbal consent to proceed.  History of Present Illness   [name] is a 58 year old male, former smoker, with severe sleep apnea who presents for COPD follow-up.  He requires Trelegy refill.  He has severe sleep apnea managed with a CPAP machine and feels he is doing fairly well with it, despite encountering issues logging into the system due to a password change. He believes the pressures are adequate and feels he is doing as well as he can with the device.  He has experienced breathing difficulties, describing it as a 'horrible problem breathing' that began approximately six months ago. The issue has somewhat improved since his last visit, although he is unsure of the cause. He mentions a past COVID-19 infection but states that his symptoms did not correlate with any findings on medical tests at the time. He recalls having difficulty  taking deep breaths, describing it as 'taking little puffs' and being unable to 'breathe in a lot'.  He was previously on Trelegy for two to three months but has been off it for about two months. He feels he managed 'pretty far' without it, although he acknowledges that it was helpful during periods of more severe symptoms. He mentions that he could have used it the previous day while mowing the lawn in hot weather. He does not currently have a rescue inhaler.  He has a history of emphysema and suspects a combination of emphysema and asthma, noting that his father had asthma. He quit smoking nine years ago in preparation for knee replacement surgery and subsequently gained 50 to 60 pounds.  He reports a history of elevated inflammation levels in his blood, which has been noted several times in the past without a clear cause.     His compliance download shows 93% usage overall with 73% usage over 4 hours.  Average usage per night is 4 hours 22 minutes.  He is set to CPAP at 13 cm H2O.  Residual AHI is 2.4.  I have reviewed his prior records.  He is enrolled in lung cancer screening program.  Last lung cancer screening CT was today.  On my review no change from prior, radiologist interpretation is pending.  Review of Systems A 10 point review of systems was performed and it is as noted above otherwise negative.   Past Medical History:  Diagnosis Date   Sleep apnea  Past Surgical History:  Procedure Laterality Date   BILATERAL CARPAL TUNNEL RELEASE Bilateral 2021   COLONOSCOPY WITH PROPOFOL  N/A 10/28/2020   TAs Romero, Ruel, MD)   JOINT REPLACEMENT Bilateral 2016   REVISION TOTAL KNEE ARTHROPLASTY Left 2020   SPINE SURGERY  2007   Fusion L5    Patient Active Problem List   Diagnosis Date Noted   Acute pain of right shoulder 10/24/2023   Bilateral wrist pain 10/24/2023   Elevated blood pressure reading in office without diagnosis of hypertension 10/18/2023   Pedal edema  10/18/2023   Abnormal weight gain 10/18/2023   Lymphadenopathy 10/18/2023   DOE (dyspnea on exertion) 10/18/2023   Positive D dimer 06/03/2023   Sebaceous cyst 04/16/2023   Anxiety 04/16/2023   Hyperglycemia 04/16/2023   Insomnia 04/01/2023   Family history of dementia 03/25/2023   Memory loss 03/25/2023   Cervical radiculopathy 03/25/2023   Elevated PSA 01/05/2022   Chronic nonintractable headache 11/17/2020   Cholelithiasis 11/17/2020   Emphysema lung (HCC) 11/14/2020   Fatty liver 11/14/2020   Lung nodule 11/14/2020   Former smoker 09/05/2020   History of lumbar fusion 08/05/2020   Morbid obesity (HCC) 08/01/2020   Dyspnea on exertion 08/01/2020   Obstructive sleep apnea syndrome 08/01/2020   Lipoma of back 08/01/2020   Caregiver stress 08/01/2020   History of total knee replacement, left 08/23/2017   LOW BACK PAIN SYNDROME 11/18/2007    Family History  Problem Relation Age of Onset   Asthma Father    Diabetes Father     Social History   Tobacco Use   Smoking status: Former    Current packs/day: 0.00    Average packs/day: 1 pack/day for 30.0 years (30.0 ttl pk-yrs)    Types: Cigarettes    Start date: 74    Quit date: 2016    Years since quitting: 9.6   Smokeless tobacco: Never  Substance Use Topics   Alcohol use: Not Currently    Allergies  Allergen Reactions   Penicillins Other (See Comments)   Oxycodone     Current Meds  Medication Sig   albuterol  (VENTOLIN  HFA) 108 (90 Base) MCG/ACT inhaler Inhale 2 puffs into the lungs every 6 (six) hours as needed.   azithromycin (ZITHROMAX) 250 MG tablet Take 250 mg by mouth as directed.   busPIRone  (BUSPAR ) 5 MG tablet TAKE 1 TABLET BY MOUTH TWICE A DAY   cyclobenzaprine  (FLEXERIL ) 10 MG tablet TAKE 1/2 TO ONE TABLET AT BEDTIME AS NEEDED FOR MUSCLE SPASM   DULoxetine  (CYMBALTA ) 30 MG capsule TAKE 1 CAPSULE BY MOUTH EVERY DAY   Fluticasone -Umeclidin-Vilant (TRELEGY ELLIPTA ) 100-62.5-25 MCG/ACT AEPB Inhale 1  puff into the lungs daily.   melatonin ([employer] MELATONIN) 5 MG TABS TAKE 1/2-1 TABLET AT BEDTIME FOR INSOMNIA   meloxicam  (MOBIC ) 7.5 MG tablet TAKE 2 TABLETS BY MOUTH EVERY DAY    Immunization History  Administered Date(s) Administered   Hepatitis B, ADULT 02/05/2017, 03/13/2017, 08/07/2017   Influenza,inj,Quad PF,6+ Mos 09/05/2020        Objective:     BP (!) 150/108 (BP Location: Left Arm, Patient Position: Sitting, Cuff Size: Normal)   Pulse 78   Temp 98.1 F (36.7 C) (Oral)   Wt 291 lb 9.6 oz (132.3 kg)   SpO2 96%   BMI 40.67 kg/m   SpO2: 96 %  GENERAL: Obese male, no acute distress, fully ambulatory, no conversational dyspnea. HEAD: Normocephalic, atraumatic.  EYES: Pupils equal, round, reactive to light.  No scleral icterus.  MOUTH: Dentition intact, oral mucosa moist.  No thrush.  Crowded airway. NECK: Supple. No thyromegaly. Trachea midline. No JVD.  No adenopathy. PULMONARY: Good air entry bilaterally.  No adventitious sounds. CARDIOVASCULAR: S1 and S2. Regular rate and rhythm.  No rubs, murmurs or gallops heard. ABDOMEN: Obese, otherwise benign. MUSCULOSKELETAL: No joint deformity, no clubbing, no edema.  NEUROLOGIC: No overt focal deficit, no gait disturbance, speech is fluent. SKIN: Intact,warm,dry. PSYCH: Mood and behavior normal.  Eosinophil count 459 cells per microliter on 18 October 2023. CRP 7.84 on 13 November 2023  Lab Results  Component Value Date   NITRICOXIDE 24 04/20/2024  *Nitric oxide  level consistent with low type II inflammation present.   Assessment & Plan:     ICD-10-CM   1. Persistent asthma without complication, unspecified asthma severity  J45.909 CBC with Differential/Platelet    Allergen Panel (27) + IGE    2. Shortness of breath  R06.02 Nitric oxide     3. Eosinophilia, unspecified type  D72.10 CBC with Differential/Platelet    Allergen Panel (27) + IGE    4. Obstructive sleep apnea syndrome  G47.33       Orders Placed  This Encounter  Procedures   CBC with Differential/Platelet    Standing Status:   Future    Number of Occurrences:   1    Expected Date:   04/20/2024    Expiration Date:   04/20/2025   Allergen Panel (27) + IGE    Standing Status:   Future    Number of Occurrences:   1    Expiration Date:   04/20/2025   Nitric oxide     Meds ordered this encounter  Medications   Fluticasone -Umeclidin-Vilant (TRELEGY ELLIPTA ) 100-62.5-25 MCG/ACT AEPB    Sig: Inhale 1 puff into the lungs daily.    Dispense:  28 each    Refill:  11   albuterol  (VENTOLIN  HFA) 108 (90 Base) MCG/ACT inhaler    Sig: Inhale 2 puffs into the lungs every 6 (six) hours as needed.    Dispense:  18 g    Refill:  3   Discussion:    Asthma and Emphysema (Asthma-COPD Overlap) Chronic respiratory issues with severe dyspnea, possibly exacerbated by previous COVID-19 infection. Reports improvement in symptoms but experiences exertional dyspnea, such as during lawn mowing. Previously on Trelegy, which was beneficial, but discontinued for two months. High airway inflammation noted in previous tests, likely related to asthma. Emphysema present but not severe. Quit smoking nine years ago. - Order FeNO test to assess airway inflammation: There is low level of type II inflammation present - Renew Trelegy prescription - Prescribe albuterol  rescue inhaler  Obstructive Sleep Apnea Managed with CPAP. Reports doing well with CPAP but unable to access data due to password issues. No significant concerns about CPAP pressures. - Will refer to Dr. Jess for management of CPAP  Allergic Rhinitis (under evaluation) Elevated eosinophil count in previous blood tests suggest possible allergic component to symptoms. - Order CBC with differential and allergen panel     Advised if symptoms do not improve or worsen, to please contact office for sooner follow up or seek emergency care.    I spent 35 minutes of dedicated to the care of this patient on  the date of this encounter to include pre-visit review of records, face-to-face time with the patient discussing conditions above, post visit ordering of testing, clinical documentation with the electronic health record, making appropriate referrals as documented, and communicating necessary findings to members of  the patients care team.   KYM Leita Sanders, MD Advanced Bronchoscopy PCCM [facility]    *This note was dictated using voice recognition software/Dragon.  Despite best efforts to proofread, errors can occur which can change the meaning. Any transcriptional errors that result from this process are unintentional and may not be fully corrected at the time of dictation.

## 2024-04-23 LAB — ALLERGEN PANEL (27) + IGE
Alternaria Alternata IgE: <0.1 kU/L
Aspergillus Fumigatus IgE: <0.1 kU/L
Bahia Grass IgE: <0.1 kU/L
Bermuda Grass IgE: <0.1 kU/L
Cat Dander IgE: <0.1 kU/L
Cedar, Mountain IgE: <0.1 kU/L
Cladosporium Herbarum IgE: <0.1 kU/L
Cocklebur IgE: <0.1 kU/L
Cockroach, American IgE: <0.1 kU/L
Common Silver Birch IgE: <0.1 kU/L
D Farinae IgE: <0.1 kU/L
D Pteronyssinus IgE: <0.1 kU/L
Dog Dander IgE: <0.1 kU/L
Elm, American IgE: <0.1 kU/L
Hickory, White IgE: <0.1 kU/L
IgE (Immunoglobulin E), Serum: 128 [IU]/mL (ref 6–495)
Johnson Grass IgE: <0.1 kU/L
Kentucky Bluegrass IgE: <0.1 kU/L
Maple/Box Elder IgE: <0.1 kU/L
Mucor Racemosus IgE: <0.1 kU/L
Oak, White IgE: <0.1 kU/L
Penicillium Chrysogen IgE: <0.1 kU/L
Pigweed, Rough IgE: <0.1 kU/L
Plantain, English IgE: <0.1 kU/L
Ragweed, Short IgE: <0.1 kU/L
Setomelanomma Rostrat: <0.1 kU/L
Timothy Grass IgE: <0.1 kU/L
White Mulberry IgE: <0.1 kU/L

## 2024-05-05 ENCOUNTER — Other Ambulatory Visit: Payer: Self-pay | Admitting: Acute Care

## 2024-05-05 ENCOUNTER — Ambulatory Visit: Payer: Self-pay | Admitting: Pulmonary Disease

## 2024-05-05 DIAGNOSIS — Z87891 Personal history of nicotine dependence: Secondary | ICD-10-CM

## 2024-05-05 DIAGNOSIS — Z122 Encounter for screening for malignant neoplasm of respiratory organs: Secondary | ICD-10-CM

## 2024-05-06 ENCOUNTER — Encounter: Payer: Self-pay | Admitting: Pulmonary Disease

## 2024-05-08 DIAGNOSIS — G4733 Obstructive sleep apnea (adult) (pediatric): Secondary | ICD-10-CM | POA: Diagnosis not present

## 2024-06-15 ENCOUNTER — Ambulatory Visit: Admitting: Family

## 2024-06-15 ENCOUNTER — Encounter: Payer: Self-pay | Admitting: Family

## 2024-06-15 VITALS — BP 138/86 | HR 84 | Temp 98.4°F | Ht 70.0 in | Wt 281.6 lb

## 2024-06-15 DIAGNOSIS — M62838 Other muscle spasm: Secondary | ICD-10-CM

## 2024-06-15 DIAGNOSIS — G4733 Obstructive sleep apnea (adult) (pediatric): Secondary | ICD-10-CM | POA: Diagnosis not present

## 2024-06-15 DIAGNOSIS — G479 Sleep disorder, unspecified: Secondary | ICD-10-CM

## 2024-06-15 DIAGNOSIS — Z23 Encounter for immunization: Secondary | ICD-10-CM | POA: Diagnosis not present

## 2024-06-15 DIAGNOSIS — M25511 Pain in right shoulder: Secondary | ICD-10-CM

## 2024-06-15 MED ORDER — MELATONIN 5 MG PO TABS: 5.0000 mg | ORAL_TABLET | Freq: Every day | ORAL | 3 refills | Status: AC

## 2024-06-15 MED ORDER — CYCLOBENZAPRINE HCL 10 MG PO TABS: ORAL_TABLET | ORAL | 0 refills | Status: AC

## 2024-06-15 NOTE — Addendum Note (Signed)
 Addended by: ALBINO SHAVER C on: 06/15/2024 09:14 AM   Modules accepted: Orders

## 2024-06-15 NOTE — Progress Notes (Signed)
 Established Patient Office Visit  Subjective:      CC:  Chief Complaint  Patient presents with   Back Pain    HPI: [name] is a 58 y.o. male presenting on 06/15/2024 for Back Pain .  Discussed the use of AI scribe software for clinical note transcription with the patient, who gave verbal consent to proceed.  History of Present Illness [name] is a 58 year old male who presents with a pulled muscle in his back.  He pulled a muscle in his back last weekend while retrieving a blanket from behind the couch cushions. 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 He reports muscle tightness and notes that while he has had pain radiating down the leg for years, this episode feels more like a muscle issue. He has been using heat for relief but notes that his dog chewed the cord of his heating pad.  He is currently on meloxicam  and uses Tylenol intermittently. He also uses a CPAP machine for sleep apnea and has been prescribed melatonin, which has significantly improved his sleep quality, allowing him to sleep without daytime sleepiness. He has lost about 30 pounds recently. He also uses Trelegy for shortness of breath, which has improved since his weight loss.  Socially, he is a Naval architect and lives with his wife and three dogs, including a pit mix and a [place]. He describes his dogs as energetic and playful, with one being particularly mischievous.  Lab Results  Component Value Date   TSH 1.69 10/18/2023   Wt Readings from Last 3 Encounters:  06/15/24 281 lb 9.6 oz (127.7 kg)  04/20/24 291 lb 9.6 oz (132.3 kg)  02/03/24 (!) 311 lb 6.4 oz (141.3 kg)           Social history:  Relevant past medical, surgical, family and social history reviewed and updated as indicated. Interim medical history since our last visit  reviewed.  Allergies and medications reviewed and updated.  DATA REVIEWED: CHART IN EPIC     ROS: Negative unless specifically indicated above in HPI.    Current Outpatient Medications:    albuterol  (VENTOLIN  HFA) 108 (90 Base) MCG/ACT inhaler, Inhale 2 puffs into the lungs every 6 (six) hours as needed., Disp: 18 g, Rfl: 3   busPIRone  (BUSPAR ) 5 MG tablet, TAKE 1 TABLET BY MOUTH TWICE A DAY, Disp: 180 tablet, Rfl: 2   DULoxetine  (CYMBALTA ) 30 MG capsule, TAKE 1 CAPSULE BY MOUTH EVERY DAY, Disp: 90 capsule, Rfl: 2   Fluticasone -Umeclidin-Vilant (TRELEGY ELLIPTA ) 100-62.5-25 MCG/ACT AEPB, Inhale 1 puff into the lungs daily., Disp: 28 each, Rfl: 11   meloxicam  (MOBIC ) 7.5 MG tablet, TAKE 2 TABLETS BY MOUTH EVERY DAY, Disp: 60 tablet, Rfl: 0   cyclobenzaprine  (FLEXERIL ) 10 MG tablet, TAKE 1/2 TO ONE TABLET AT BEDTIME AS NEEDED FOR MUSCLE SPASM, Disp: 30 tablet, Rfl: 0   melatonin ([employer] MELATONIN) 5 MG TABS, Take 1 tablet (5 mg total) by mouth at bedtime., Disp: 90 tablet, Rfl: 3        Objective:        BP 138/86 (BP Location: Left Arm, Patient Position: Sitting, Cuff Size: Large)   Pulse 84   Temp 98.4 F (36.9 C) (Temporal)   Ht 5' 10 (1.778 m)   Wt 281 lb 9.6 oz (127.7 kg)   SpO2 95%  BMI 40.41 kg/m   Physical Exam MUSCULOSKELETAL: No spinal tenderness.  Wt Readings from Last 3 Encounters:  06/15/24 281 lb 9.6 oz (127.7 kg)  04/20/24 291 lb 9.6 oz (132.3 kg)  02/03/24 (!) 311 lb 6.4 oz (141.3 kg)    Physical Exam Musculoskeletal:     Lumbar back: Spasms and tenderness present. No bony tenderness. Negative right straight leg raise test and negative left straight leg raise test.          Results   Assessment & Plan:   Assessment and Plan Assessment & Plan Acute lower back muscle strain Acute lower back muscle strain likely due to a recent incident involving pulling a blanket. The strain is causing muscle tightness without significant radiation of pain  down the leg. No spinal tenderness, but tightness on the sides is present. Exacerbated by prolonged sitting, especially in hard chairs, and by his occupation as a Naval architect. - Prescribe muscle relaxer - Advise use of heat, icy hot, or lidocaine  patches - Recommend gentle stretching exercises - Advise use of Tylenol intermittently for pain management  Insomnia Insomnia has improved with melatonin, providing deeper sleep without daytime sleepiness. Consistent use of CPAP machine is contributing to better sleep quality. - Refill melatonin prescription       Return in about 6 months (around 12/14/2024) for f/u CPE.     Ginger Patrick, MSN, APRN, FNP-C [facility]

## 2024-06-30 DIAGNOSIS — R9389 Abnormal findings on diagnostic imaging of other specified body structures: Secondary | ICD-10-CM | POA: Diagnosis not present

## 2024-06-30 DIAGNOSIS — K219 Gastro-esophageal reflux disease without esophagitis: Secondary | ICD-10-CM | POA: Diagnosis not present

## 2024-07-13 ENCOUNTER — Ambulatory Visit: Admitting: Nurse Practitioner

## 2024-08-04 ENCOUNTER — Other Ambulatory Visit: Payer: Self-pay | Admitting: Family

## 2024-08-04 DIAGNOSIS — F419 Anxiety disorder, unspecified: Secondary | ICD-10-CM

## 2024-08-17 ENCOUNTER — Encounter: Admission: RE | Disposition: A | Payer: Self-pay | Source: Home / Self Care | Attending: Gastroenterology

## 2024-08-17 ENCOUNTER — Ambulatory Visit: Admitting: Anesthesiology

## 2024-08-17 ENCOUNTER — Ambulatory Visit
Admission: RE | Admit: 2024-08-17 | Discharge: 2024-08-17 | Disposition: A | Attending: Gastroenterology | Admitting: Gastroenterology

## 2024-08-17 DIAGNOSIS — F419 Anxiety disorder, unspecified: Secondary | ICD-10-CM | POA: Diagnosis not present

## 2024-08-17 DIAGNOSIS — E6689 Other obesity not elsewhere classified: Secondary | ICD-10-CM | POA: Insufficient documentation

## 2024-08-17 DIAGNOSIS — Z6841 Body Mass Index (BMI) 40.0 and over, adult: Secondary | ICD-10-CM | POA: Diagnosis not present

## 2024-08-17 DIAGNOSIS — R933 Abnormal findings on diagnostic imaging of other parts of digestive tract: Secondary | ICD-10-CM | POA: Diagnosis not present

## 2024-08-17 DIAGNOSIS — G4733 Obstructive sleep apnea (adult) (pediatric): Secondary | ICD-10-CM | POA: Diagnosis not present

## 2024-08-17 DIAGNOSIS — G4730 Sleep apnea, unspecified: Secondary | ICD-10-CM | POA: Insufficient documentation

## 2024-08-17 DIAGNOSIS — J449 Chronic obstructive pulmonary disease, unspecified: Secondary | ICD-10-CM | POA: Insufficient documentation

## 2024-08-17 DIAGNOSIS — Z87891 Personal history of nicotine dependence: Secondary | ICD-10-CM | POA: Diagnosis not present

## 2024-08-17 DIAGNOSIS — R9389 Abnormal findings on diagnostic imaging of other specified body structures: Secondary | ICD-10-CM | POA: Diagnosis not present

## 2024-08-17 HISTORY — PX: ESOPHAGOGASTRODUODENOSCOPY: SHX5428

## 2024-08-17 SURGERY — EGD (ESOPHAGOGASTRODUODENOSCOPY)
Anesthesia: General

## 2024-08-17 MED ORDER — PROPOFOL 1000 MG/100ML IV EMUL
INTRAVENOUS | Status: AC
  Filled 2024-08-17: qty 100

## 2024-08-17 MED ORDER — PROPOFOL 500 MG/50ML IV EMUL
INTRAVENOUS | Status: DC | PRN
  Administered 2024-08-17: 08:00:00 150 ug/kg/min via INTRAVENOUS

## 2024-08-17 MED ORDER — PROPOFOL 10 MG/ML IV BOLUS
INTRAVENOUS | Status: DC | PRN
  Administered 2024-08-17: 08:00:00 70 mg via INTRAVENOUS

## 2024-08-17 MED ORDER — LIDOCAINE HCL (CARDIAC) PF 100 MG/5ML IV SOSY
PREFILLED_SYRINGE | INTRAVENOUS | Status: DC | PRN
  Administered 2024-08-17: 08:00:00 50 mg via INTRAVENOUS

## 2024-08-17 MED ORDER — SODIUM CHLORIDE 0.9 % IV SOLN
INTRAVENOUS | Status: DC
  Administered 2024-08-17: 07:00:00 20 mL/h via INTRAVENOUS

## 2024-08-17 NOTE — Op Note (Signed)
 [facility] Patient Name: [name] Procedure Date: 08/17/2024 6:57 AM MRN: [id number] Account #: [id number] Date of Birth: 04-28-1966 Admit Type: Outpatient Age: 58 Room: [facility] ENDO ROOM 1 Gender: Male Note Status: Finalized Instrument Name: Endoscope [id number] Procedure:             Upper GI endoscopy Indications:           Abnormal CT of the GI tract Providers:             Ruel Kung MD, MD Referring MD:          Ginger Patrick (Referring MD) Medicines:             Monitored Anesthesia Care Complications:         No immediate complications. Procedure:             Pre-Anesthesia Assessment:                        - Prior to the procedure, a History and Physical was                         performed, and patient medications, allergies and                         sensitivities were reviewed. The patient's tolerance                         of previous anesthesia was reviewed.                        - The risks and benefits of the procedure and the                         sedation options and risks were discussed with the                         patient. All questions were answered and informed                         consent was obtained.                        - ASA Grade Assessment: II - A patient with mild                         systemic disease.                        After obtaining informed consent, the endoscope was                         passed under direct vision. Throughout the procedure,                         the patient's blood pressure, pulse, and oxygen                         saturations were monitored continuously. The Endoscope  was introduced through the mouth, and advanced to the                         third part of duodenum. The upper GI endoscopy was                         accomplished with ease. The patient tolerated the                         procedure well. Findings:      The esophagus was  normal.      The stomach was normal.      The examined duodenum was normal. Impression:            - Normal esophagus.                        - Normal stomach.                        - Normal examined duodenum.                        - No specimens collected. Recommendation:        - Discharge patient to home (with escort).                        - Resume previous diet.                        - Continue present medications.                        - Return to my office as previously scheduled. Procedure Code(s):     --- Professional ---                        [phone], Esophagogastroduodenoscopy, flexible,                         transoral; diagnostic, including collection of                         specimen(s) by brushing or washing, when performed                         (separate procedure) Diagnosis Code(s):     --- Professional ---                        R93.3, Abnormal findings on diagnostic imaging of                         other parts of digestive tract CPT copyright 2022 American Medical Association. All rights reserved. The codes documented in this report are preliminary and upon coder review may  be revised to meet current compliance requirements. Ruel Kung, MD Ruel Kung MD, MD 08/17/2024 8:14:52 AM This report has been signed electronically. Number of Addenda: 0 Note Initiated On: 08/17/2024 6:57 AM Estimated Blood Loss:  Estimated blood loss: none.      [facility]

## 2024-08-17 NOTE — Anesthesia Preprocedure Evaluation (Signed)
 Anesthesia Evaluation  Patient identified by MRN, date of birth, ID band Patient awake    Reviewed: Allergy & Precautions, NPO status , Patient's Chart, lab work & pertinent test results  Airway Mallampati: II  TM Distance: >3 FB Neck ROM: full    Dental  (+) Missing, Poor Dentition   Pulmonary neg pulmonary ROS, sleep apnea and Continuous Positive Airway Pressure Ventilation , COPD, former smoker   Pulmonary exam normal  + decreased breath sounds      Cardiovascular Exercise Tolerance: Good negative cardio ROS Normal cardiovascular exam Rhythm:Regular Rate:Normal     Neuro/Psych  Headaches  Anxiety     negative neurological ROS  negative psych ROS   GI/Hepatic negative GI ROS, Neg liver ROS,,,  Endo/Other  negative endocrine ROS  Class 4 obesity  Renal/GU negative Renal ROS  negative genitourinary   Musculoskeletal negative musculoskeletal ROS (+)    Abdominal  (+) + obese  Peds negative pediatric ROS (+)  Hematology negative hematology ROS (+)   Anesthesia Other Findings Past Medical History: No date: Sleep apnea  Past Surgical History: 2021: BILATERAL CARPAL TUNNEL RELEASE; Bilateral 10/28/2020: COLONOSCOPY WITH PROPOFOL ; N/A     Comment:  TAs Romero Bi, MD) 2016: JOINT REPLACEMENT; Bilateral 2020: REVISION TOTAL KNEE ARTHROPLASTY; Left 2007: SPINE SURGERY     Comment:  Fusion L5  BMI    Body Mass Index: 41.70 kg/m      Reproductive/Obstetrics negative OB ROS                              Anesthesia Physical Anesthesia Plan  ASA: 3  Anesthesia Plan: General   Post-op Pain Management:    Induction: Intravenous  PONV Risk Score and Plan: Propofol  infusion and TIVA  Airway Management Planned: Natural Airway and Nasal Cannula  Additional Equipment:   Intra-op Plan:   Post-operative Plan:   Informed Consent: I have reviewed the patients History and Physical,  chart, labs and discussed the procedure including the risks, benefits and alternatives for the proposed anesthesia with the patient or authorized representative who has indicated his/her understanding and acceptance.     Dental Advisory Given  Plan Discussed with: CRNA  Anesthesia Plan Comments:         Anesthesia Quick Evaluation

## 2024-08-17 NOTE — Anesthesia Procedure Notes (Signed)
 Procedure Name: MAC Date/Time: 08/17/2024 8:08 AM  Performed by: Lacretia Camelia NOVAK, CRNAPre-anesthesia Checklist: Patient identified, Emergency Drugs available, Suction available and Patient being monitored Patient Re-evaluated:Patient Re-evaluated prior to induction Oxygen Delivery Method: Nasal cannula

## 2024-08-17 NOTE — Anesthesia Postprocedure Evaluation (Signed)
 Anesthesia Post Note  Patient: [name]  Procedure(s) Performed: EGD (ESOPHAGOGASTRODUODENOSCOPY)  Patient location during evaluation: PACU Anesthesia Type: General Level of consciousness: awake Pain management: satisfactory to patient Vital Signs Assessment: post-procedure vital signs reviewed and stable Respiratory status: spontaneous breathing Cardiovascular status: stable Anesthetic complications: no   No notable events documented.   Last Vitals:  Vitals:   08/17/24 0829 08/17/24 0839  BP: (!) 132/97 129/78  Pulse: 65 70  Resp: 19 14  Temp:    SpO2: 98% 99%    Last Pain:  Vitals:   08/17/24 0839  TempSrc:   PainSc: 0-No pain                 VAN STAVEREN,Cyra Spader

## 2024-08-17 NOTE — H&P (Signed)
 Ruel Kung , MD [address], [address], [place], [place], [address] Phone: [phone] Fax: [phone]  Primary Care Physician:  Corwin Antu, FNP   Pre-Procedure History & Physical: HPI:  [name] is a 58 y.o. male is here for an endoscopy    Past Medical History:  Diagnosis Date   Sleep apnea     Past Surgical History:  Procedure Laterality Date   BILATERAL CARPAL TUNNEL RELEASE Bilateral 2021   COLONOSCOPY WITH PROPOFOL  N/A 10/28/2020   TAs Romero Ruel, MD)   JOINT REPLACEMENT Bilateral 2016   REVISION TOTAL KNEE ARTHROPLASTY Left 2020   SPINE SURGERY  2007   Fusion L5    Prior to Admission medications  Medication Sig Start Date End Date Taking? Authorizing Provider  albuterol  (VENTOLIN  HFA) 108 (90 Base) MCG/ACT inhaler Inhale 2 puffs into the lungs every 6 (six) hours as needed. 04/20/24  Yes Tamea Dedra CROME, MD  busPIRone  (BUSPAR ) 5 MG tablet TAKE 1 TABLET BY MOUTH TWICE A DAY 08/04/24  Yes Dugal, Tabitha, FNP  cyclobenzaprine  (FLEXERIL ) 10 MG tablet TAKE 1/2 TO ONE TABLET AT BEDTIME AS NEEDED FOR MUSCLE SPASM 06/15/24  Yes Dugal, Tabitha, FNP  DULoxetine  (CYMBALTA ) 30 MG capsule TAKE 1 CAPSULE BY MOUTH EVERY DAY 02/18/24  Yes Dugal, Tabitha, FNP  Fluticasone -Umeclidin-Vilant (TRELEGY ELLIPTA ) 100-62.5-25 MCG/ACT AEPB Inhale 1 puff into the lungs daily. 04/20/24  Yes Tamea Dedra CROME, MD  melatonin ([facility] MELATONIN) 5 MG TABS Take 1 tablet (5 mg total) by mouth at bedtime. 06/15/24  Yes Dugal, Antu, FNP  meloxicam  (MOBIC ) 7.5 MG tablet TAKE 2 TABLETS BY MOUTH EVERY DAY 03/01/24  Yes Gregory Edsel Ruth, PA    Allergies as of 07/03/2024 - Review Complete 06/15/2024  Allergen Reaction Noted   Penicillins Other (See Comments) 08/05/2020   Oxycodone  04/23/2019    Family History  Problem Relation Age of Onset   Asthma Father    Diabetes Father     Social History   Socioeconomic History   Marital status: Married    Spouse name: Not on file    Number of children: Not on file   Years of education: 12   Highest education level: Associate degree: occupational, scientist, product/process development, or vocational program  Occupational History   Not on file  Tobacco Use   Smoking status: Former    Current packs/day: 0.00    Average packs/day: 1 pack/day for 30.0 years (30.0 ttl pk-yrs)    Types: Cigarettes    Start date: 18    Quit date: 2016    Years since quitting: 9.9   Smokeless tobacco: Never  Vaping Use   Vaping status: Never Used  Substance and Sexual Activity   Alcohol use: Not Currently   Drug use: Never   Sexual activity: Yes  Other Topics Concern   Not on file  Social History Narrative   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 Strong faith.    Social Drivers of Health   Tobacco Use: Medium Risk (06/15/2024)   Patient History    Smoking Tobacco Use: Former    Smokeless Tobacco Use: Never    Passive Exposure: Not on file  Financial Resource Strain: Low Risk (11/12/2023)   Overall Financial Resource Strain (CARDIA)    Difficulty of Paying Living Expenses: Not very hard  Food Insecurity: No Food Insecurity (11/12/2023)   Hunger Vital Sign  Worried About Programme Researcher, Broadcasting/film/video in the Last Year: Never true    Ran Out of Food in the Last Year: Never true  Transportation Needs: No Transportation Needs (11/12/2023)   PRAPARE - Administrator, Civil Service (Medical): No    Lack of Transportation (Non-Medical): No  Physical Activity: Insufficiently Active (11/12/2023)   Exercise Vital Sign    Days of Exercise per Week: 6 days    Minutes of Exercise per Session: 10 min  Stress: Stress Concern Present (11/12/2023)   [employer] of Occupational Health - Occupational Stress Questionnaire    Feeling of Stress : To some extent  Social Connections: Moderately Isolated (11/12/2023)   Social Connection and Isolation Panel    Frequency  of Communication with Friends and Family: Never    Frequency of Social Gatherings with Friends and Family: Never    Attends Religious Services: 1 to 4 times per year    Active Member of Clubs or Organizations: No    Attends Engineer, Structural: Not on file    Marital Status: Married  Catering Manager Violence: Not on file  Depression (PHQ2-9): High Risk (10/18/2023)   Depression (PHQ2-9)    PHQ-2 Score: 19  Alcohol Screen: Not on file  Housing: Unknown (06/30/2024)   Received from [facility]   Epic    At any time in the past 12 months, were you homeless or living in a shelter (including now)?: No    Number of Times Moved in the Last Year: Not on file    Unable to Pay for Housing in the Last Year: Not on file  Utilities: Not on file  Health Literacy: Not on file    Review of Systems: See HPI, otherwise negative ROS  Physical Exam: BP (!) 155/107   Pulse 71   Temp (!) 96.1 F (35.6 C) (Temporal)   Resp 20   Ht 5' 10 (1.778 m)   Wt 131.8 kg   SpO2 97%   BMI 41.70 kg/m  General:   Alert,  pleasant and cooperative in NAD Head:  Normocephalic and atraumatic. Neck:  Supple; no masses or thyromegaly. Lungs:  Clear throughout to auscultation, normal respiratory effort.    Heart:  +S1, +S2, Regular rate and rhythm, No edema. Abdomen:  Soft, nontender and nondistended. Normal bowel sounds, without guarding, and without rebound.   Neurologic:  Alert and  oriented x4;  grossly normal neurologically.  Impression/Plan: [name] is here for an endoscopy  to be performed for  evaluation of abnormal ct scan findings    Risks, benefits, limitations, and alternatives regarding endoscopy have been reviewed with the patient.  Questions have been answered.  All parties agreeable.   Ruel Kung, MD  08/17/2024, 8:05 AM

## 2024-08-17 NOTE — Transfer of Care (Signed)
 Immediate Anesthesia Transfer of Care Note  Patient: [name]  Procedure(s) Performed: EGD (ESOPHAGOGASTRODUODENOSCOPY)  Patient Location: PACU and Endoscopy Unit  Anesthesia Type:General  Level of Consciousness: drowsy and patient cooperative  Airway & Oxygen Therapy: Patient Spontanous Breathing and Patient connected to nasal cannula oxygen  Post-op Assessment: Report given to RN and Post -op Vital signs reviewed and stable  Post vital signs: Reviewed and stable  Last Vitals:  Vitals Value Taken Time  BP    Temp    Pulse    Resp    SpO2      Last Pain:  Vitals:   08/17/24 0706  TempSrc: Temporal  PainSc: 0-No pain         Complications: No notable events documented.

## 2024-08-18 ENCOUNTER — Encounter: Payer: Self-pay | Admitting: Gastroenterology

## 2024-08-24 ENCOUNTER — Ambulatory Visit: Admitting: Nurse Practitioner

## 2024-08-24 ENCOUNTER — Encounter: Payer: Self-pay | Admitting: Nurse Practitioner

## 2024-08-24 VITALS — BP 144/94 | HR 79 | Temp 98.0°F | Ht 70.0 in | Wt 296.6 lb

## 2024-08-24 DIAGNOSIS — E669 Obesity, unspecified: Secondary | ICD-10-CM

## 2024-08-24 DIAGNOSIS — G4733 Obstructive sleep apnea (adult) (pediatric): Secondary | ICD-10-CM

## 2024-08-24 DIAGNOSIS — J439 Emphysema, unspecified: Secondary | ICD-10-CM | POA: Diagnosis not present

## 2024-08-24 DIAGNOSIS — Z87891 Personal history of nicotine dependence: Secondary | ICD-10-CM | POA: Diagnosis not present

## 2024-08-24 DIAGNOSIS — Z6841 Body Mass Index (BMI) 40.0 and over, adult: Secondary | ICD-10-CM

## 2024-08-24 DIAGNOSIS — R911 Solitary pulmonary nodule: Secondary | ICD-10-CM | POA: Diagnosis not present

## 2024-08-24 NOTE — Assessment & Plan Note (Signed)
 No formal obstruction on prior PFT. Emphysematous changes on imaging. Clinically improved with Trelegy. Action plan in place. Encouraged to work on graded exercises. Trigger prevention.

## 2024-08-24 NOTE — Progress Notes (Signed)
 "  @Patient  ID: [name], male    DOB: 1965/10/11, 59 y.o.   MRN: [id number]  Chief Complaint  Patient presents with   Medical Management of Chronic Issues    Using CPAP every nights 4-5 hours. Pressure, feels low.  Occasional cough with cold wether. Shortness of breath on exertion.     Referring provider: Corwin Antu, FNP  HPI: 58 year old male, former smoker followed for emphysema, lung nodules and OSA on CPAP. He is a patient of Dr. Tamea and last seen in office 04/20/2024. Past medical history significant for cervical radiculopathy, LAD, anxiety, obesity.   TESTS/EVENTS: 01/11/2021 PFT: FVC 81, FEV1 86, ratio 82, TLC 89, DLCO 94 10/18/2023 CTA chest: no PE. Clear lungs. Trachea and central airways are clear. Stable 4 mm LUL nodule. Other additional nodules not well defined. Hepatic steatosis. Mild esophageal wall thickening 04/20/2024 LDCT chest LCS: lung RADS 2. Cholelithiasis. Emphysema.  04/20/2024 FeNO 24 ppb  04/20/2024 eos 500   04/20/2024: OV with Dr. Tamea. Severe OSA on CPAP. Feels he is doing fairly well on this. Pressures feel adequate. Breathing has improved since his last visit. Previously on Trelegy for 2-3 months but has been off for about 2 months. Does feel it was helpful in the past. FeNO ordered and normal. Advised to resume Trelegy. Provided with rescue inhaler rx as well.  08/24/2024: Today - follow up Discussed the use of AI scribe software for clinical note transcription with the patient, who gave verbal consent to proceed.  History of Present Illness [name] is a 58 year old male with sleep apnea and asthma/COPD who presents for follow-up of his respiratory conditions.  His breathing has been generally stable, although he experiences some difficulty on colder days. He uses Trelegy daily and an albuterol  inhaler a few times a week, which helps manage his symptoms effectively. No significant cough, wheezing, chest tightness, CP, leg swelling. No  exacerbations.   He uses a CPAP machine for sleep apnea most nights. He finds the initial ramp pressure too low, causing him to feel 'starving for air' and sometimes removes the mask so he doesn't always get to the 4 hour mark. He does feel he receives benefit from use. No issues with drowsy driving. Energy is decent during the day. No pressure problems otherwise.       Allergies[1]  Immunization History  Administered Date(s) Administered   Hepatitis B, ADULT 02/05/2017, 03/13/2017, 08/07/2017   Influenza, Seasonal, Injecte, Preservative Fre 06/15/2024   Influenza,inj,Quad PF,6+ Mos 09/05/2020    Past Medical History:  Diagnosis Date   Sleep apnea     Tobacco History: Tobacco Use History[2] Counseling given: Not Answered   Outpatient Medications Prior to Visit  Medication Sig Dispense Refill   albuterol  (VENTOLIN  HFA) 108 (90 Base) MCG/ACT inhaler Inhale 2 puffs into the lungs every 6 (six) hours as needed. 18 g 3   azithromycin (ZITHROMAX) 250 MG tablet Take 250 mg by mouth as directed.     Fluticasone -Umeclidin-Vilant (TRELEGY ELLIPTA ) 100-62.5-25 MCG/ACT AEPB Inhale 1 puff into the lungs daily. 28 each 11   melatonin ([employer] MELATONIN) 5 MG TABS Take 1 tablet (5 mg total) by mouth at bedtime. 90 tablet 3   omeprazole (PRILOSEC) 40 MG capsule Take 40 mg by mouth daily.     busPIRone  (BUSPAR ) 5 MG tablet TAKE 1 TABLET BY MOUTH TWICE A DAY (Patient not taking: Reported on 08/24/2024) 180 tablet 1   cyclobenzaprine  (FLEXERIL ) 10 MG tablet TAKE 1/2  TO ONE TABLET AT BEDTIME AS NEEDED FOR MUSCLE SPASM (Patient not taking: Reported on 08/24/2024) 30 tablet 0   DULoxetine  (CYMBALTA ) 30 MG capsule TAKE 1 CAPSULE BY MOUTH EVERY DAY (Patient not taking: Reported on 08/24/2024) 90 capsule 2   meloxicam  (MOBIC ) 7.5 MG tablet TAKE 2 TABLETS BY MOUTH EVERY DAY (Patient not taking: Reported on 08/24/2024) 60 tablet 0   No facility-administered medications prior to visit.     Review of  Systems: as above    Physical Exam:  BP (!) 144/94   Pulse 79   Temp 98 F (36.7 C) (Temporal)   Ht 5' 10 (1.778 m)   Wt 296 lb 9.6 oz (134.5 kg)   SpO2 97%   BMI 42.56 kg/m   GEN: Pleasant, interactive, well-appearing; obese; in no acute distress HEENT:  Normocephalic and atraumatic. PERRLA. Sclera white. Nasal turbinates pink, moist and patent bilaterally. No rhinorrhea present. Oropharynx pink and moist, without exudate or edema. No lesions, ulcerations, or postnasal drip.  NECK:  Supple w/ fair ROM.  No lymphadenopathy.   CV: RRR, no m/r/g, no peripheral edema. Pulses intact, +2 bilaterally. No cyanosis, pallor or clubbing. PULMONARY:  Unlabored, regular breathing. Clear bilaterally A&P w/o wheezes/rales/rhonchi. No accessory muscle use.  GI: BS present and normoactive. Soft, non-tender to palpation.  Neuro: A/Ox3. No focal deficits noted.   Skin: Warm, no lesions or rashe Psych: Normal affect and behavior. Judgement and thought content appropriate.     Lab Results:  CBC    Component Value Date/Time   WBC 8.3 04/20/2024 1651   RBC 4.91 04/20/2024 1651   HGB 15.2 04/20/2024 1651   HCT 42.3 04/20/2024 1651   PLT 192 04/20/2024 1651   MCV 86.2 04/20/2024 1651   MCH 31.0 04/20/2024 1651   MCHC 35.9 04/20/2024 1651   RDW 13.8 04/20/2024 1651   LYMPHSABS 2.8 04/20/2024 1651   MONOABS 0.5 04/20/2024 1651   EOSABS 0.5 04/20/2024 1651   BASOSABS 0.1 04/20/2024 1651    BMET    Component Value Date/Time   NA 139 10/18/2023 0821   K 4.1 10/18/2023 0821   CL 103 10/18/2023 0821   CO2 28 10/18/2023 0821   GLUCOSE 99 10/18/2023 0821   BUN 12 10/18/2023 0821   CREATININE 1.00 10/18/2023 0821   CREATININE 0.94 03/22/2023 1100   CALCIUM 8.8 10/18/2023 0821   GFRNONAA >60 05/09/2007 0410   GFRAA  05/09/2007 0410    >60        The eGFR has been calculated using the MDRD equation. This calculation has not been validated in all clinical    BNP No results found  for: BNP   Imaging:  No results found.  Administration History     None          Latest Ref Rng & Units 02/03/2024    9:05 AM  PFT Results  FVC-Pre L 3.98   FVC-Predicted Pre % 78   FVC-Post L 3.86   FVC-Predicted Post % 75   Pre FEV1/FVC % % 80   Post FEV1/FCV % % 81   FEV1-Pre L 3.20   FEV1-Predicted Pre % 81   FEV1-Post L 3.13   DLCO uncorrected ml/min/mmHg 35.11   DLCO UNC% % 120   DLVA Predicted % 137   TLC L 6.00   TLC % Predicted % 83   RV % Predicted % 89     Lab Results  Component Value Date   NITRICOXIDE 24 04/20/2024  Assessment & Plan:   Obstructive sleep apnea syndrome Suboptimal compliance. Good control. Encouraged to increase usage. Decrease use partially related to difficulties with ramp pressure. Ramp turned off today. Advised to notify of any ongoing issues. Receives benefit from use. Aware of risks of untreated OSA. Safe driving practices reviewed.   Patient Instructions  Continue Trelegy 1 puff daily. Brush tongue and rinse mouth afterwards Continue Albuterol  inhaler 2 puffs every 6 hours as needed for shortness of breath or wheezing. Notify if symptoms persist despite rescue inhaler/neb use.    Continue to use CPAP every night, minimum of 4-6 hours a night.  Change equipment as directed. Wash your tubing with warm soap and water daily, hang to dry. Wash humidifier portion weekly. Use bottled, distilled water and change daily Be aware of reduced alertness and do not drive or operate heavy machinery if experiencing this or drowsiness.  Exercise encouraged, as tolerated. Healthy weight management discussed.  Avoid or decrease alcohol consumption and medications that make you more sleepy, if possible. Notify if persistent daytime sleepiness occurs even with consistent use of PAP therapy.  Turned ramp off    Follow up with Dr Tamea in 6 months. If symptoms do not improve or worsen, please contact office for sooner follow up or  seek emergency care.    Emphysema lung (HCC) No formal obstruction on prior PFT. Emphysematous changes on imaging. Clinically improved with Trelegy. Action plan in place. Encouraged to work on graded exercises. Trigger prevention.   Lung nodule Lung RADS 2. Follow with lung cancer screening program as scheduled. Due August 2026   Advised if symptoms do not improve or worsen, to please contact office for sooner follow up or seek emergency care.   I spent 32 minutes of dedicated to the care of this patient on the date of this encounter to include pre-visit review of records, face-to-face time with the patient discussing conditions above, post visit ordering of testing, clinical documentation with the electronic health record, making appropriate referrals as documented, and communicating necessary findings to members of the patients care team.  Comer LULLA Rouleau, NP 08/24/2024  Pt aware and understands NP's role.      [1]  Allergies Allergen Reactions   Penicillins Other (See Comments)   Oxycodone   [2]  Social History Tobacco Use  Smoking Status Former   Current packs/day: 0.00   Average packs/day: 1 pack/day for 30.0 years (30.0 ttl pk-yrs)   Types: Cigarettes   Start date: 38   Quit date: 2016   Years since quitting: 9.9  Smokeless Tobacco Never   "

## 2024-08-24 NOTE — Assessment & Plan Note (Signed)
 Suboptimal compliance. Good control. Encouraged to increase usage. Decrease use partially related to difficulties with ramp pressure. Ramp turned off today. Advised to notify of any ongoing issues. Receives benefit from use. Aware of risks of untreated OSA. Safe driving practices reviewed.   Patient Instructions  Continue Trelegy 1 puff daily. Brush tongue and rinse mouth afterwards Continue Albuterol  inhaler 2 puffs every 6 hours as needed for shortness of breath or wheezing. Notify if symptoms persist despite rescue inhaler/neb use.    Continue to use CPAP every night, minimum of 4-6 hours a night.  Change equipment as directed. Wash your tubing with warm soap and water daily, hang to dry. Wash humidifier portion weekly. Use bottled, distilled water and change daily Be aware of reduced alertness and do not drive or operate heavy machinery if experiencing this or drowsiness.  Exercise encouraged, as tolerated. Healthy weight management discussed.  Avoid or decrease alcohol consumption and medications that make you more sleepy, if possible. Notify if persistent daytime sleepiness occurs even with consistent use of PAP therapy.  Turned ramp off    Follow up with Dr Tamea in 6 months. If symptoms do not improve or worsen, please contact office for sooner follow up or seek emergency care.

## 2024-08-24 NOTE — Addendum Note (Signed)
 Addended by: ROGELIA LARAY RAMAN on: 08/24/2024 04:02 PM   Modules accepted: Orders

## 2024-08-24 NOTE — Assessment & Plan Note (Signed)
 Lung RADS 2. Follow with lung cancer screening program as scheduled. Due August 2026

## 2024-08-24 NOTE — Patient Instructions (Addendum)
 Continue Trelegy 1 puff daily. Brush tongue and rinse mouth afterwards Continue Albuterol  inhaler 2 puffs every 6 hours as needed for shortness of breath or wheezing. Notify if symptoms persist despite rescue inhaler/neb use.    Continue to use CPAP every night, minimum of 4-6 hours a night.  Change equipment as directed. Wash your tubing with warm soap and water daily, hang to dry. Wash humidifier portion weekly. Use bottled, distilled water and change daily Be aware of reduced alertness and do not drive or operate heavy machinery if experiencing this or drowsiness.  Exercise encouraged, as tolerated. Healthy weight management discussed.  Avoid or decrease alcohol consumption and medications that make you more sleepy, if possible. Notify if persistent daytime sleepiness occurs even with consistent use of PAP therapy.  Turned ramp off    Follow up with Dr Tamea in 6 months. If symptoms do not improve or worsen, please contact office for sooner follow up or seek emergency care.

## 2024-12-14 ENCOUNTER — Encounter: Admitting: Family
